# Patient Record
Sex: Female | Born: 1995 | Race: White | Hispanic: No | Marital: Single | Smoking: Never smoker
Health system: Southern US, Community
[De-identification: ages and names within clinical notes are randomized; demographics above are authoritative.]

## PROBLEM LIST (undated history)

## (undated) DIAGNOSIS — G43909 Migraine, unspecified, not intractable, without status migrainosus: Secondary | ICD-10-CM

## (undated) HISTORY — PX: WISDOM TOOTH EXTRACTION: SHX21

## (undated) HISTORY — PX: TYMPANOSTOMY TUBE PLACEMENT: SHX32

---
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
## 2017-01-15 ENCOUNTER — Telehealth: Payer: Self-pay | Admitting: Neurology

## 2017-01-15 NOTE — Telephone Encounter (Signed)
While working on insurance Botox pre-authorization  I noticMidwifeed she is not on the next Botox schedule so I called to see if she is going to proceed with Botox. [place] not [place] able to have Botox in May (our next Botox date). She said she will try to do it in August. She said she will see Dr. Everlena CooperJaffe before August for a follow up appointment. I told her we will work on her insurance pre-approval for Botox after her follow up appointment with Dr. Everlena CooperJaffe.

## 2017-01-24 ENCOUNTER — Ambulatory Visit (INDEPENDENT_AMBULATORY_CARE_PROVIDER_SITE_OTHER): Payer: Managed Care, Other (non HMO) | Admitting: Neurology

## 2017-01-24 ENCOUNTER — Encounter: Payer: Self-pay | Admitting: Neurology

## 2017-01-24 VITALS — BP 106/70 | HR 73 | Ht 64.0 in | Wt 150.2 lb

## 2017-01-24 DIAGNOSIS — R519 Headache, unspecified: Secondary | ICD-10-CM

## 2017-01-24 DIAGNOSIS — G43709 Chronic migraine without aura, not intractable, without status migrainosus: Secondary | ICD-10-CM

## 2017-01-24 DIAGNOSIS — R51 Headache: Secondary | ICD-10-CM

## 2017-01-24 MED ORDER — VENLAFAXINE HCL ER 75 MG PO CP24: 225.0000 mg | ORAL_CAPSULE | Freq: Every day | ORAL | 3 refills | Status: DC

## 2017-01-24 NOTE — Patient Instructions (Signed)
1.  We will increase venlafaxine XR to 225mg  daily.  I will prescribe you 75mg  pills, so take 3 pills daily at once 2.  Limit use of Aleve to 2 days out of the week. 3.  Continue Suzan Nailernzetra Xsail for severe migraine.  Don't use Maxalt anymore. 4.  Will get MRI of brain without contrast 5.  Will get you set up for Botox ASAP

## 2017-01-24 NOTE — Progress Notes (Signed)
NEUROLOGY FOLLOW UP OFFICE NOTE  Leslie Hahn [id number]  HISTORY OF PRESENT ILLNESS: Leslie Hahn is an 21 year old right-handed female with migraines who follows up for migraine.     UPDATE: She reports increased headaches over the past 2 months.  She reports no specific new triggers but may be change in weather.  Still waiting on pre-authorization for Botox. Intensity:  8/10 severe, 4/10 moderate Duration:  Within 2 hours with Leslie Hahn Frequency:  Severe headaches are now 2 days a week (from once a month).  Still with daily moderate headache. Frequency of abortive medication: 2 days a week. Current NSAIDS:  Aleve 660mg  Current analgesics:  no  Current triptans:  Leslie Hahn Current anti-emetic:  no Current muscle relaxants:  no Current anti-anxiolytic:  no Current sleep aide:  no Current Antihypertensive medications:  no Current Antidepressant medications:  venlafaxine XR 150mg  Current Anticonvulsant medications:  no Current Vitamins/Herbal/Supplements:  no Current Antihistamines/Decongestants:  no Other therapy:  no   Caffeine:  no Alcohol:  no Smoker:  no Diet:  Decreased hydration Depression/stress/anxiety:  Her boyfriend says she has increased anxiety.  Increased due to relationship problems with her boyfriend. Sleep hygiene:  good   HISTORY:  Onset:  She sustained a concussion on 10/10/13 while playing soccer. She did not lose consciousness. Migraines started soon afterwards.. Location:  Holocephalic but often right sided Quality:  Sharp/throbbing Initial Intensity:  8/10 severe, 4/10 daily Aura:  no Prodrome:  Hunger, thirsty Associated symptoms:  Nausea, photophobia, phonophobia, osmophobia, sometimes blurred vision or vomiting Initial Duration:  Severe migraines from 3 days up to 2 weeks. Initial Frequency:  Severe migraines once a month (from 3 days to 2 weeks), otherwise daily headache Triggers/exacerbating factors:  no Relieving factors:   no Activity:  Cannot function when severe   Past NSAIDS:  no Past analgesics: Excedrin Past abortive triptans:  sumatriptan tablet, sumatriptan Leslie Hahn, Leslie Hahn Past antihypertensive medications:  no Past antidepressant medications:  amitriptyline (fair improvement) Past anticonvulsant medications:  topiramate 200mg  (only mild improvement and caused irritability)   Family history of headache:  Father has migraines, paternal grandmother had headaches   As per note from social worker, she had an MRI of the brain on 11/30/14, which was normal  PAST MEDICAL HISTORY: Past Medical History:  Diagnosis Date  . Migraine     MEDICATIONS: Current Outpatient Prescriptions on File Prior to Visit  Medication Sig Dispense Refill  . albuterol (PROVENTIL HFA;VENTOLIN HFA) 108 (90 Base) MCG/ACT inhaler Inhale 1-2 puffs into the lungs every 6 (six) hours as needed for wheezing or shortness of breath.    . AMBULATORY NON FORMULARY MEDICATION 1 each by Other route as directed. Medication Name: cefaly band 1 each 0  . cephALEXin (KEFLEX) 500 MG capsule Take 1 capsule (500 mg total) by mouth 2 (two) times daily. 14 capsule 0  . divalproex (DEPAKOTE ER) 500 MG 24 hr tablet     . drospirenone-ethinyl estradiol (NIKKI) 3-0.02 MG tablet Take 1 tablet by mouth daily.    Marland Kitchen loratadine (CLARITIN) 10 MG tablet Take 10 mg by mouth daily.    . naproxen sodium (ANAPROX) 220 MG tablet Take 220 mg by mouth 2 (two) times daily with a meal.    . penicillin v potassium (VEETID) 500 MG tablet     . rizatriptan (Leslie Hahn) 10 MG tablet Take 1 tablet (10 mg total) by mouth as needed for migraine. May repeat in 2 hours if needed 10 tablet 0  . SUMAtriptan  Succinate (ONZETRA XSAIL) 11 MG/NOSEPC EXHP Place 11 mg into the nose as directed. At earliest onset of migraine. May repeat in 2 hours if needed 8 each 3   No current facility-administered medications on file prior to visit.     ALLERGIES: Allergies  Allergen Reactions  .  Compazine [Prochlorperazine Edisylate] Anxiety  . Reglan [Metoclopramide] Anxiety    FAMILY HISTORY: No family history on file.  SOCIAL HISTORY: Social History   Social History  . Marital status: Single    Spouse name: N/A  . Number of children: N/A  . Years of education: N/A   Occupational History  . Not on file.   Social History Main Topics  . Smoking status: Never Smoker  . Smokeless tobacco: Never Used  . Alcohol use No  . Drug use: No  . Sexual activity: Yes    Birth control/ protection: Pill   Other Topics Concern  . Not on file   Social History Narrative  . No narrative on file    REVIEW OF SYSTEMS: Constitutional: No fevers, chills, or sweats, no generalized fatigue, change in appetite Eyes: No visual changes, double vision, eye pain Ear, nose and throat: No hearing loss, ear pain, nasal congestion, sore throat Cardiovascular: No chest pain, palpitations Respiratory:  No shortness of breath at rest or with exertion, wheezes GastrointestinaI: No nausea, vomiting, diarrhea, abdominal pain, fecal incontinence Genitourinary:  No dysuria, urinary retention or frequency Musculoskeletal:  No neck pain, back pain Integumentary: No rash, pruritus, skin lesions Neurological: as above Psychiatric: No depression, insomnia, anxiety Endocrine: No palpitations, fatigue, diaphoresis, mood swings, change in appetite, change in weight, increased thirst Hematologic/Lymphatic:  No purpura, petechiae. Allergic/Immunologic: no itchy/runny eyes, nasal congestion, recent allergic reactions, rashes  PHYSICAL EXAM: Vitals:   01/24/17 0736  BP: 106/70  Pulse: 73   General: No acute distress.  Patient appears well-groomed.  normal body habitus. Head:  Normocephalic/atraumatic Eyes:  Fundi examined but not visualized Neck: supple, no paraspinal tenderness, full range of motion Heart:  Regular rate and rhythm Lungs:  Clear to auscultation bilaterally Back: No paraspinal  tenderness Neurological Exam: alert and oriented to person, place, and time. Attention span and concentration intact, recent and remote memory intact, fund of knowledge intact.  Speech fluent and not dysarthric, language intact.  CN II-XII intact. Bulk and tone normal, muscle strength 5/5 throughout.  Sensation to light touch  intact.  Deep tendon reflexes 2+ throughout.  Finger to nose testing intact.  Gait normal  IMPRESSION: Chronic migraine, worsening  PLAN: 1.  Since she is having increase in severe migraines, I would like to check an MRI of the brain.  She reportedly had an MRI several years ago, but I do not have those images or report and this is new since then. 2.  She had a positive response to venlafaxine, so we will increase dose to 225mg  daily. 3.  Will get Botox set up ASAP. 4.  Aleve or Leslie Nailernzetra Xsail (limited to no more than 2 days out of the week).  Shon MilletAdam Damyia Strider, DO

## 2017-01-29 ENCOUNTER — Ambulatory Visit
Admission: RE | Admit: 2017-01-29 | Discharge: 2017-01-29 | Disposition: A | Discharge status: Home / Self Care | Payer: Managed Care, Other (non HMO) | Source: Ambulatory Visit | Attending: Neurology | Admitting: Neurology

## 2017-01-29 DIAGNOSIS — R519 Headache, unspecified: Secondary | ICD-10-CM

## 2017-01-29 DIAGNOSIS — R51 Headache: Principal | ICD-10-CM

## 2017-01-30 ENCOUNTER — Telehealth: Payer: Self-pay | Admitting: *Deleted

## 2017-01-30 ENCOUNTER — Encounter: Payer: Self-pay | Admitting: *Deleted

## 2017-01-30 NOTE — Telephone Encounter (Signed)
-----   Message from Drema DallasAdam R Jaffe, DO sent at 01/30/2017  6:35 AM EST ----- MRI of brain is normal

## 2017-01-30 NOTE — Telephone Encounter (Signed)
Results given to patient via My Chart.

## 2017-02-05 ENCOUNTER — Ambulatory Visit: Admit: 2017-02-05 | Discharge: 2017-02-05 | Payer: PRIVATE HEALTH INSURANCE | Attending: Family

## 2017-02-05 DIAGNOSIS — D229 Melanocytic nevi, unspecified: Secondary | ICD-10-CM

## 2017-02-05 NOTE — Unmapped (Signed)
Spoke with mom Lawson Fiscal) regarding biopsy results.  Benign. No further treatment required.

## 2017-02-05 NOTE — Unmapped (Signed)
Chief Complaint   Patient presents with   ??? Skin Lesion     FSE, pt is concern @ mole on R arm       HPI: Pt is 21 y.o.  white female who is here today for evaluation of the following:    1. Asymptomatic brown and tan lesion scattered on body x years. No known changes. No mod factors. No treatments.  2. Tender brown and pink lesion on right upper arm x 6 months. No known trauma. No mod factors. No treatments.    Review of Systems   Skin as above.  Hematological: Does not bruise/bleed easily.   Allergies reviewed     Derm History: LOV: 02/26/2016--per [facility]  Family history of melanoma- (-)  Personal history of NMSC or MM- (-)  sunburns easily- (+)  uses sunscreen- (+)  history of tanning bed use- (-)    Physical Examination: FSE    Examination was performed of the following: scalp/hair, head/face, conjunctivae/eyelids, gums/teeth/lips, neck, breast/axilla/chest, abdomen, back, RUE, LUE, RLE, LLE, nails/digits and buttocks    Abnormalities noted include: RUE    1. Body with scattered round well defined uniformly colored brown and pink smooth macules and papules  2. Right upper arm with 5 mm brown to pink well define round smooth papule.    A & P:    1. Multiple nevi - benign appearing  - diagnosis and etiology reviewed  - reassured patient regarding benign nature of lesions  - no treatment medically necessary  -educated on ABCDE of changing lesions- RTC for any changes  -annual FSE- perform monthly home self exams- may take pictures to compare    2. IDN - right upper arm shave biopsy  -education  Time out performed.  1% lidocaine with epinephrine injected.  Biopsy performed to the dermal layer of skin and sent to pathology.  Patient educated about scar, s/s of infection, dyspigmentation- written instructions provided to patient in a packet.    Post surgical area may appear red, this is normal. Report any abnormal drainage or odor from site.

## 2017-02-21 ENCOUNTER — Encounter (HOSPITAL_COMMUNITY): Payer: Self-pay | Admitting: *Deleted

## 2017-02-21 ENCOUNTER — Emergency Department (HOSPITAL_COMMUNITY)
Admission: EM | Admit: 2017-02-21 | Discharge: 2017-02-21 | Disposition: A | Discharge status: Home / Self Care | Payer: Managed Care, Other (non HMO) | Attending: Emergency Medicine | Admitting: Emergency Medicine

## 2017-02-21 DIAGNOSIS — Z79899 Other long term (current) drug therapy: Secondary | ICD-10-CM | POA: Diagnosis not present

## 2017-02-21 DIAGNOSIS — R51 Headache: Secondary | ICD-10-CM | POA: Insufficient documentation

## 2017-02-21 DIAGNOSIS — R519 Headache, unspecified: Secondary | ICD-10-CM

## 2017-02-21 MED ORDER — SODIUM CHLORIDE 0.9 % IV BOLUS (SEPSIS)
500.0000 mL | Freq: Once | INTRAVENOUS | Status: AC
  Administered 2017-02-21: 500 mL via INTRAVENOUS

## 2017-02-21 MED ORDER — DIPHENHYDRAMINE HCL 50 MG/ML IJ SOLN
25.0000 mg | Freq: Once | INTRAMUSCULAR | Status: AC
  Administered 2017-02-21: 25 mg via INTRAVENOUS
  Filled 2017-02-21: qty 1

## 2017-02-21 MED ORDER — KETOROLAC TROMETHAMINE 30 MG/ML IJ SOLN
30.0000 mg | Freq: Once | INTRAMUSCULAR | Status: AC
  Administered 2017-02-21: 30 mg via INTRAVENOUS
  Filled 2017-02-21: qty 1

## 2017-02-21 MED ORDER — ACETAMINOPHEN 325 MG PO TABS
650.0000 mg | ORAL_TABLET | Freq: Once | ORAL | Status: AC
  Administered 2017-02-21: 650 mg via ORAL
  Filled 2017-02-21: qty 2

## 2017-02-21 MED ORDER — ONDANSETRON HCL 4 MG/2ML IJ SOLN
4.0000 mg | Freq: Once | INTRAMUSCULAR | Status: AC
  Administered 2017-02-21: 4 mg via INTRAVENOUS
  Filled 2017-02-21: qty 2

## 2017-02-21 NOTE — ED Provider Notes (Signed)
[facility]-EMERGENCY DEPT Provider Note   CSN: [id number] Arrival date & time: 02/21/17  1259     History   Chief Complaint Chief Complaint  Patient presents with  . Migraine    HPI Leslie Hahn is a 21 y.o. female.  Patient with history of migraine headaches presents with complaint of two-day history of occipital headache consistent with previous. Patient has had associated dizziness. No aura. She has associated phonophobia and photophobia. No improvement with home medications. No weakness in arms or legs. No facial droop or difficulty speaking. She feels lightheaded with walking but has not fallen. She had subjective fever this morning but has no pain in her neck. She has no history of head trauma. No dental pain or sinus infection symptoms reported. The onset of this condition was acute. The course is constant.       Past Medical History:  Diagnosis Date  . Migraine     There are no active problems to display for this patient.   Past Surgical History:  Procedure Laterality Date  . TYMPANOSTOMY TUBE PLACEMENT    . WISDOM TOOTH EXTRACTION      OB History    No data available       Home Medications    Prior to Admission medications   Medication Sig Start Date End Date Taking? Authorizing Provider  albuterol (PROVENTIL HFA;VENTOLIN HFA) 108 (90 Base) MCG/ACT inhaler Inhale 1-2 puffs into the lungs every 6 (six) hours as needed for wheezing or shortness of breath.   Yes Historical Provider, MD  drospirenone-ethinyl estradiol (NIKKI) 3-0.02 MG tablet Take 1 tablet by mouth daily.   Yes Historical Provider, MD  loratadine (CLARITIN) 10 MG tablet Take 10 mg by mouth daily as needed for allergies.    Yes Historical Provider, MD  naproxen sodium (ANAPROX) 220 MG tablet Take 660 mg by mouth 2 (two) times daily as needed (pain).    Yes Historical Provider, MD  rizatriptan (MAXALT) 10 MG tablet Take 1 tablet (10 mg total) by mouth as needed for migraine. May repeat in 2 hours if  needed 12/16/16  Yes Adam Mliss Fritz Jaffe, DO  SUMAtriptan Succinate (ONZETRA XSAIL) 11 MG/NOSEPC EXHP Place 11 mg into the nose as directed. At earliest onset of migraine. May repeat in 2 hours if needed 09/17/16  Yes Drema DallasAdam R Jaffe, DO  venlafaxine XR (EFFEXOR-XR) 75 MG 24 hr capsule Take 3 capsules (225 mg total) by mouth daily with breakfast. 01/24/17  Yes Drema DallasAdam R Jaffe, DO    Family History No family history on file.  Social History Social History  Substance Use Topics  . Smoking status: Never Smoker  . Smokeless tobacco: Never Used  . Alcohol use No     Allergies   Compazine [prochlorperazine edisylate] and Reglan [metoclopramide]   Review of Systems Review of Systems  Constitutional: Negative for fever.  HENT: Negative for congestion, dental problem, rhinorrhea and sinus pressure.   Eyes: Positive for photophobia. Negative for discharge, redness and visual disturbance.  Respiratory: Negative for shortness of breath.   Cardiovascular: Negative for chest pain.  Gastrointestinal: Negative for nausea and vomiting.  Musculoskeletal: Negative for gait problem, neck pain and neck stiffness.  Skin: Negative for rash.  Neurological: Positive for light-headedness and headaches. Negative for syncope, speech difficulty, weakness and numbness.  Psychiatric/Behavioral: Negative for confusion.     Physical Exam Updated Vital Signs BP 119/72 (BP Location: Right Arm)   Pulse (!) 116   Temp 99 F (37.2 C) (Oral)  Resp 14   Ht 5\' 4"  (1.626 m)   Wt 63.5 kg   LMP 01/25/2017   SpO2 100%   BMI 24.03 kg/m   Physical Exam  Constitutional: She is oriented to person, place, and time. She appears well-developed and well-nourished.  Pt lying in a dark room.   HENT:  Head: Normocephalic and atraumatic.  Right Ear: Tympanic membrane, external ear and ear canal normal.  Left Ear: Tympanic membrane, external ear and ear canal normal.  Nose: Nose normal.  Mouth/Throat: Uvula is midline, oropharynx  is clear and moist and mucous membranes are normal.  Eyes: Conjunctivae, EOM and lids are normal. Pupils are equal, round, and reactive to light. Right eye exhibits no nystagmus. Left eye exhibits no nystagmus.  Neck: Normal range of motion. Neck supple.  Cardiovascular: Normal rate and regular rhythm.   No murmur heard. Pulmonary/Chest: Effort normal and breath sounds normal. No respiratory distress. She has no wheezes. She has no rales.  Abdominal: Soft. There is no tenderness.  Musculoskeletal:       Cervical back: She exhibits normal range of motion, no tenderness and no bony tenderness.  Neurological: She is alert and oriented to person, place, and time. She has normal strength and normal reflexes. No cranial nerve deficit or sensory deficit. She displays a negative Romberg sign. Coordination and gait normal. GCS eye subscore is 4. GCS verbal subscore is 5. GCS motor subscore is 6.  Skin: Skin is warm and dry.  Psychiatric: She has a normal mood and affect.  Nursing note and vitals reviewed.    ED Treatments / Results   Procedures Procedures (including critical care time)  Medications Ordered in ED Medications - No data to display   Initial Impression / Assessment and Plan / ED Course  I have reviewed the triage vital signs and the nursing notes.  Pertinent labs & imaging results that were available during my care of the patient were reviewed by me and considered in my medical decision making (see chart for details).     Patient seen and examined. Work-up initiated. Medications ordered.   Vital signs reviewed and are as follows: BP 119/72 (BP Location: Right Arm)   Pulse (!) 116   Temp 99 F (37.2 C) (Oral)   Resp 14   Ht 5\' 4"  (1.626 m)   Wt 63.5 kg   LMP 01/25/2017   SpO2 100%   BMI 24.03 kg/m   3:26 PM patient feels much better and is ready for discharge to home. Of note, temperature is 99.16F. On reexam, she continues to have no signs of meningismus. Tylenol  ordered.  Patient encouraged to return to emergency department if she develops worsening headache, persistent vomiting, pain with moving her neck, confusion.   Patient counseled to return if they have weakness in their arms or legs, slurred speech, trouble walking or talking, confusion, trouble with their balance, or if they have any other concerns. Patient verbalizes understanding and agrees with plan.    Final Clinical Impressions(s) / ED Diagnoses   Final diagnoses:  Bad headache   Patient with Occipital headache consistent with previous headaches. Patient without high-risk features of headache including: sudden onset/thunderclap HA, no similar headache in past, altered mental status, accompanying seizure, headache with exertion, age > 6, history of immunocompromise, neck or shoulder pain, fever, use of anticoagulation, family history of spontaneous SAH, concomitant drug use, toxic exposure.   Patient has a normal complete neurological exam, normal vital signs, normal level of consciousness,  no signs of meningismus, is well-appearing/non-toxic appearing, no signs of trauma.  Imaging with CT/MRI not indicated given history and physical exam findings.   No dangerous or life-threatening conditions suspected or identified by history, physical exam, and by work-up. No indications for hospitalization identified.    New Prescriptions Discharge Medication List as of 02/21/2017  3:10 PM       Renne Crigler, PA-C 02/21/17 1527    Canary Brim Tegeler, MD 02/21/17 2009

## 2017-02-21 NOTE — Discharge Instructions (Signed)
Please read and follow all provided instructions.  Your diagnoses today include:  1. Bad headache     Tests performed today include:  Vital signs. See below for your results today.   Medications:  In the Emergency Department you received:  Zofran - antinausea/headache medication  Benadryl - antihistamine to counteract potential side effects of reglan  Toradol - NSAID medication similar to ibuprofen  Take any prescribed medications only as directed.  Additional information:  Follow any educational materials contained in this packet.  You are having a headache. No specific cause was found today for your headache. It may have been a migraine or other cause of headache. Stress, anxiety, fatigue, and depression are common triggers for headaches.   Your headache today does not appear to be life-threatening or require hospitalization, but often the exact cause of headaches is not determined in the emergency department. Therefore, follow-up with your doctor is very important to find out what may have caused your headache and whether or not you need any further diagnostic testing or treatment.   Sometimes headaches can appear benign (not harmful), but then more serious symptoms can develop which should prompt an immediate re-evaluation by your doctor or the emergency department.  BE VERY CAREFUL not to take multiple medicines containing Tylenol (also called acetaminophen). Doing so can lead to an overdose which can damage your liver and cause liver failure and possibly death.   Follow-up instructions: Please follow-up with your primary care provider in the next 3 days for further evaluation of your symptoms.   Return instructions:   Please return to the Emergency Department if you experience worsening symptoms.  Return if the medications do not resolve your headache, if it recurs, or if you have multiple episodes of vomiting or cannot keep down fluids.  Return if you have a change  from the usual headache.  RETURN IMMEDIATELY IF you:  Develop a sudden, severe headache  Develop confusion or become poorly responsive or faint  Develop a fever above 100.31F or problem breathing  Have a change in speech, vision, swallowing, or understanding  Develop new weakness, numbness, tingling, incoordination in your arms or legs  Have a seizure  Please return if you have any other emergent concerns.  Additional Information:  Your vital signs today were: BP 102/60 (BP Location: Left Arm)    Pulse (!) 103    Temp 99.9 F (37.7 C) (Oral)    Resp 12    Ht 5\' 4"  (1.626 m)    Wt 63.5 kg    LMP 01/25/2017    SpO2 97%    BMI 24.03 kg/m  If your blood pressure (BP) was elevated above 135/85 this visit, please have this repeated by your doctor within one month. --------------

## 2017-02-21 NOTE — ED Triage Notes (Signed)
Pt complains of 9/10 migraine since last night. Pt has not taken medication for migraine. Pt states she is sensitive to light and sound.

## 2017-02-23 ENCOUNTER — Emergency Department (HOSPITAL_COMMUNITY)
Admission: EM | Admit: 2017-02-23 | Discharge: 2017-02-24 | Disposition: A | Discharge status: Home / Self Care | Payer: Managed Care, Other (non HMO) | Attending: Emergency Medicine | Admitting: Emergency Medicine

## 2017-02-23 ENCOUNTER — Emergency Department (HOSPITAL_COMMUNITY): Payer: Managed Care, Other (non HMO)

## 2017-02-23 ENCOUNTER — Encounter (HOSPITAL_COMMUNITY): Payer: Self-pay | Admitting: Oncology

## 2017-02-23 DIAGNOSIS — G43009 Migraine without aura, not intractable, without status migrainosus: Secondary | ICD-10-CM | POA: Insufficient documentation

## 2017-02-23 DIAGNOSIS — Z79899 Other long term (current) drug therapy: Secondary | ICD-10-CM | POA: Diagnosis not present

## 2017-02-23 DIAGNOSIS — G43909 Migraine, unspecified, not intractable, without status migrainosus: Secondary | ICD-10-CM | POA: Diagnosis present

## 2017-02-23 LAB — BASIC METABOLIC PANEL
Anion gap: 7 (ref 5–15)
BUN: 9 mg/dL (ref 6–20)
CO2: 26 mmol/L (ref 22–32)
Calcium: 9.4 mg/dL (ref 8.9–10.3)
Chloride: 105 mmol/L (ref 101–111)
Creatinine, Ser: 0.77 mg/dL (ref 0.44–1.00)
GFR calc non Af Amer: >60 mL/min (ref >60)
Glucose, Bld: 89 mg/dL (ref 65–99)
POTASSIUM: 3.8 mmol/L (ref 3.5–5.1)
Sodium: 138 mmol/L (ref 135–145)

## 2017-02-23 LAB — CBC WITH DIFFERENTIAL/PLATELET
BASOS ABS: 0 10*3/uL (ref 0.0–0.1)
BASOS PCT: 0 %
Eosinophils Absolute: 0.4 10*3/uL (ref 0.0–0.7)
Eosinophils Relative: 4 %
HEMATOCRIT: 36.1 % (ref 36.0–46.0)
HEMOGLOBIN: 12 g/dL (ref 12.0–15.0)
LYMPHS PCT: 33 %
Lymphs Abs: 3.4 10*3/uL (ref 0.7–4.0)
MCH: 27.6 pg (ref 26.0–34.0)
MCHC: 33.2 g/dL (ref 30.0–36.0)
MCV: 83 fL (ref 78.0–100.0)
MONO ABS: 1 10*3/uL (ref 0.1–1.0)
MONOS PCT: 10 %
NEUTROS PCT: 53 %
Neutro Abs: 5.5 10*3/uL (ref 1.7–7.7)
Platelets: 324 10*3/uL (ref 150–400)
RBC: 4.35 MIL/uL (ref 3.87–5.11)
RDW: 13.2 % (ref 11.5–15.5)
WBC: 10.4 10*3/uL (ref 4.0–10.5)

## 2017-02-23 LAB — URINALYSIS, ROUTINE W REFLEX MICROSCOPIC
BILIRUBIN URINE: NEGATIVE
Glucose, UA: NEGATIVE mg/dL
KETONES UR: NEGATIVE mg/dL
LEUKOCYTES UA: NEGATIVE
NITRITE: NEGATIVE
PROTEIN: NEGATIVE mg/dL
SPECIFIC GRAVITY, URINE: 1.02 (ref 1.005–1.030)
pH: 5 (ref 5.0–8.0)

## 2017-02-23 LAB — I-STAT BETA HCG BLOOD, ED (MC, WL, AP ONLY): I-stat hCG, quantitative: <5 m[IU]/mL (ref <5)

## 2017-02-23 LAB — RAPID URINE DRUG SCREEN, HOSP PERFORMED
Amphetamines: NOT DETECTED
Barbiturates: NOT DETECTED
Benzodiazepines: NOT DETECTED
Cocaine: NOT DETECTED
OPIATES: NOT DETECTED
Tetrahydrocannabinol: NOT DETECTED

## 2017-02-23 MED ORDER — KETOROLAC TROMETHAMINE 30 MG/ML IJ SOLN
30.0000 mg | Freq: Once | INTRAMUSCULAR | Status: AC
  Administered 2017-02-23: 30 mg via INTRAVENOUS
  Filled 2017-02-23: qty 1

## 2017-02-23 MED ORDER — SODIUM CHLORIDE 0.9 % IV BOLUS (SEPSIS)
1000.0000 mL | Freq: Once | INTRAVENOUS | Status: AC
  Administered 2017-02-23: 1000 mL via INTRAVENOUS

## 2017-02-23 MED ORDER — ONDANSETRON HCL 4 MG/2ML IJ SOLN
4.0000 mg | Freq: Once | INTRAMUSCULAR | Status: AC
  Administered 2017-02-23: 4 mg via INTRAVENOUS
  Filled 2017-02-23: qty 2

## 2017-02-23 NOTE — ED Provider Notes (Signed)
[facility]-EMERGENCY DEPT Provider Note   CSN: [id number] Arrival date & time: 02/23/17  2110     History   Chief Complaint Chief Complaint  Patient presents with  . Migraine    HPI Leslie Hahn is a 21 y.o. female.  HPI   Leslie Hahn is a 21 y.o. female, with a history of migraine, presenting to the ED with a headache recurrent since 3/23. Her current headache is left sided occipital and forward to behind her left eye. Pain is throbbing/shooting and rated 8/10. This pain pattern is similar to previous headaches. Endorses photophobia and nausea.   Patient states she was seen for a migraine headache on 3/23. The treatments performed in the ED relieved her pain completely. The next day, her headache came back and she began to have increased pain when she turned her head. She initially stated that she returned today because of "neck stiffness," but then clarified that she didn't feel like she has difficulty turning her head or putting chin to chest, but rather she didn't want to move her head as much because doing so increased the pain. She states that this sensation has since reduced significantly.   Patient is prescribed effexor for migraines and is compliant. She has not been taking any medications once this headache began, so last aleve was 3/22. She states she has been drinking at least 75oz of water daily.   She sees Dr. Everlena Cooper here while she is in school at [employer] [facility] [employer]. She is from [place] and sees a neurologist there as well. She states she is supposed to start Botox soon for treatment of her migraines as she is having them on a weekly basis.   She denies vomiting, fever, visual disturbances, LOC, neuro deficits, or any other complaints. LMP around February 24.  Past Medical History:  Diagnosis Date  . Migraine     There are no active problems to display for this patient.   Past Surgical History:  Procedure Laterality Date  . TYMPANOSTOMY TUBE PLACEMENT    . WISDOM TOOTH EXTRACTION       OB History    No data available       Home Medications    Prior to Admission medications   Medication Sig Start Date End Date Taking? Authorizing Provider  albuterol (PROVENTIL HFA;VENTOLIN HFA) 108 (90 Base) MCG/ACT inhaler Inhale 1-2 puffs into the lungs every 6 (six) hours as needed for wheezing or shortness of breath.   Yes Historical Provider, MD  drospirenone-ethinyl estradiol (NIKKI) 3-0.02 MG tablet Take 1 tablet by mouth daily.   Yes Historical Provider, MD  loratadine (CLARITIN) 10 MG tablet Take 10 mg by mouth daily as needed for allergies.    Yes Historical Provider, MD  rizatriptan (MAXALT) 10 MG tablet Take 1 tablet (10 mg total) by mouth as needed for migraine. May repeat in 2 hours if needed 12/16/16  Yes Drema Dallas, DO  venlafaxine XR (EFFEXOR-XR) 75 MG 24 hr capsule Take 3 capsules (225 mg total) by mouth daily with breakfast. 01/24/17  Yes Drema Dallas, DO  butalbital-acetaminophen-caffeine (FIORICET, ESGIC) [phone] MG tablet Take 1-2 tablets by mouth every 6 (six) hours as needed for headache. 02/24/17 02/24/18  Shawn C Joy, PA-C  SUMAtriptan Succinate (ONZETRA XSAIL) 11 MG/NOSEPC EXHP Place 11 mg into the nose as directed. At earliest onset of migraine. May repeat in 2 hours if needed Patient not taking: Reported on 02/23/2017 09/17/16   Drema Dallas, DO    Family History No  family history on file.  Social History Social History  Substance Use Topics  . Smoking status: Never Smoker  . Smokeless tobacco: Never Used  . Alcohol use No     Allergies   Compazine [prochlorperazine edisylate] and Reglan [metoclopramide]   Review of Systems Review of Systems  Constitutional: Negative for chills, diaphoresis and fever.  HENT: Negative for sore throat.   Eyes: Negative for visual disturbance.  Respiratory: Negative for shortness of breath.   Cardiovascular: Negative for chest pain.  Gastrointestinal: Positive for nausea. Negative for abdominal pain and  vomiting.  Musculoskeletal: Positive for neck pain. Negative for back pain.  Skin: Negative for rash.  Neurological: Positive for headaches. Negative for dizziness, syncope, weakness and numbness.  All other systems reviewed and are negative.    Physical Exam Updated Vital Signs BP 125/85 (BP Location: Right Arm)   Pulse 96   Temp 98.9 F (37.2 C) (Oral)   Resp 18   Ht 5\' 4"  (1.626 m)   Wt 63.5 kg   LMP 01/25/2017   SpO2 100%   BMI 24.03 kg/m   Physical Exam  Constitutional: She appears well-developed and well-nourished. No distress.  HENT:  Head: Normocephalic and atraumatic.  Mouth/Throat: Oropharynx is clear and moist.  Eyes: Conjunctivae and EOM are normal. Pupils are equal, round, and reactive to light.  Pupils about 6mm, equal bilaterally.  Neck: Normal range of motion. Neck supple. No Brudzinski's sign and no Kernig's sign noted.  Patient moves her neck without hesitation.   Cardiovascular: Normal rate, regular rhythm, normal heart sounds and intact distal pulses.   Pulmonary/Chest: Effort normal and breath sounds normal. No respiratory distress.  Abdominal: Soft. There is no tenderness. There is no guarding.  Musculoskeletal: She exhibits no edema.  Tenderness to the left cervical musculature into the left occipital region.  Normal motor function intact in all extremities and spine. No midline spinal tenderness.   Lymphadenopathy:    She has no cervical adenopathy.  Neurological: She is alert.  No sensory deficits. Strength 5/5 in all extremities. No gait disturbance. Coordination intact including heel to shin and finger to nose. Cranial nerves III-XII grossly intact. No facial droop.   Skin: Skin is warm and dry. She is not diaphoretic.  Psychiatric: She has a normal mood and affect. Her behavior is normal.  Nursing note and vitals reviewed.    ED Treatments / Results  Labs (all labs ordered are listed, but only abnormal results are displayed) Labs Reviewed    URINALYSIS, ROUTINE W REFLEX MICROSCOPIC - Abnormal; Notable for the following:       Result Value   APPearance HAZY (*)    Hgb urine dipstick MODERATE (*)    Bacteria, UA RARE (*)    Squamous Epithelial / LPF 0-5 (*)    All other components within normal limits  BASIC METABOLIC PANEL  CBC WITH DIFFERENTIAL/PLATELET  RAPID URINE DRUG SCREEN, HOSP PERFORMED  I-STAT BETA HCG BLOOD, ED ([facility], [facility], [facility] ONLY)    EKG  EKG Interpretation None       Radiology Ct Head Wo Contrast  Result Date: 02/23/2017 CLINICAL DATA:  Migraine headache. EXAM: CT HEAD WITHOUT CONTRAST TECHNIQUE: Contiguous axial images were obtained from the base of the skull through the vertex without intravenous contrast. COMPARISON:  MRI of January 29, 2017. FINDINGS: Brain: No evidence of acute infarction, hemorrhage, hydrocephalus, extra-axial collection or mass lesion/mass effect. Vascular: No hyperdense vessel or unexpected calcification. Skull: Normal. Negative for fracture or focal lesion.  Sinuses/Orbits: No acute finding. Other: None. IMPRESSION: Normal head CT. Electronically Signed   By: Lupita Raider, M.D.   On: 02/23/2017 23:49    Procedures Procedures (including critical care time)  Medications Ordered in ED Medications  sodium chloride 0.9 % bolus 1,000 mL (0 mLs Intravenous Stopped 02/24/17 0017)  ketorolac (TORADOL) 30 MG/ML injection 30 mg (30 mg Intravenous Given 02/23/17 2309)  ondansetron (ZOFRAN) injection 4 mg (4 mg Intravenous Given 02/23/17 2309)  dexamethasone (DECADRON) injection 10 mg (10 mg Intravenous Given 02/24/17 0014)     Initial Impression / Assessment and Plan / ED Course  I have reviewed the triage vital signs and the nursing notes.  Pertinent labs & imaging results that were available during my care of the patient were reviewed by me and considered in my medical decision making (see chart for details).     Patient presents with a recurrence of her headache. She was initially  triaged with a complaint of "neck stiffness," however, upon obtaining further details, patient has some increase in her headache with movement of her neck, but no stiffness. My suspicion for meningitis in this patient is very low. She has no neuro or functional deficits.  Patient is nontoxic appearing, afebrile, not tachycardic, not tachypneic, not hypotensive, maintains SPO2 of 98-100% on room air, and is in no apparent distress. Patient has no signs of sepsis or other serious or life-threatening condition.  Patient had complete resolution in her headache with conservative measures here in the ED. Patient encouraged to follow up with her neurologist as soon as possible. The patient was given instructions for home care as well as return precautions. Patient voices understanding of these instructions, accepts the plan, and is comfortable with discharge.   Vitals:   02/23/17 2230 02/23/17 2305 02/23/17 2309 02/24/17 0013  BP: 120/72 118/77 118/77 112/72  Pulse: 81 82 87 79  Resp: 16  18 16   Temp:      TempSrc:      SpO2: 99% 98% 100% 100%  Weight:      Height:         Final Clinical Impressions(s) / ED Diagnoses   Final diagnoses:  Migraine without aura and without status migrainosus, not intractable    New Prescriptions Discharge Medication List as of 02/24/2017  1:18 AM    START taking these medications   Details  butalbital-acetaminophen-caffeine (FIORICET, ESGIC) 50-325-40 MG tablet Take 1-2 tablets by mouth every 6 (six) hours as needed for headache., Starting Mon 02/24/2017, Until Tue 02/24/2018, Print         Anselm Pancoast, PA-C 02/24/17 0236    Dione Booze, MD 03/19/17 1214

## 2017-02-23 NOTE — ED Notes (Signed)
Patient transported to CT 

## 2017-02-23 NOTE — ED Triage Notes (Signed)
Pt c/o HA, stiffness in her neck, lethargy, sensitivity to light/sound and , "Feeling out of it."  Sx present since Friday.  Pt states she was seen Friday for the same and instructed to return should the HA get worse to she were to develop neck stiffness.  Pt rates pain 8/10.

## 2017-02-24 MED ORDER — DEXAMETHASONE SODIUM PHOSPHATE 10 MG/ML IJ SOLN
10.0000 mg | Freq: Once | INTRAMUSCULAR | Status: AC
  Administered 2017-02-24: 10 mg via INTRAVENOUS
  Filled 2017-02-24: qty 1

## 2017-02-24 MED ORDER — BUTALBITAL-APAP-CAFFEINE 50-325-40 MG PO TABS: 1.0000 | ORAL_TABLET | Freq: Four times a day (QID) | ORAL | 0 refills | Status: AC | PRN

## 2017-02-24 NOTE — Discharge Instructions (Signed)
You have been seen today for headache. Please continue to drink plenty of fluids to stay well hydrated. Reduce stress. Get plenty of sleep each night. For onset of headache, you may try using the Fioricet. Alternatively, you may use any of the medications you have been previously prescribed for treatment of your headaches. We discussed possibly adding sumatriptan to your regimen, however, you have already been prescribed this in the form of the nasal spray. Please follow-up with your primary care provider as well as your neurologist on this matter.

## 2017-02-24 NOTE — ED Notes (Signed)
ED Provider at bedside. 

## 2017-03-04 ENCOUNTER — Ambulatory Visit (INDEPENDENT_AMBULATORY_CARE_PROVIDER_SITE_OTHER): Payer: Managed Care, Other (non HMO) | Admitting: Neurology

## 2017-03-04 DIAGNOSIS — G43709 Chronic migraine without aura, not intractable, without status migrainosus: Secondary | ICD-10-CM

## 2017-03-04 DIAGNOSIS — IMO0002 Reserved for concepts with insufficient information to code with codable children: Secondary | ICD-10-CM

## 2017-03-04 MED ORDER — ONABOTULINUMTOXINA 100 UNITS IJ SOLR
155.0000 [IU] | Freq: Once | INTRAMUSCULAR | Status: AC
  Administered 2017-03-04: 155 [IU] via INTRAMUSCULAR

## 2017-03-04 NOTE — Progress Notes (Signed)
[facility]   Procedure Note Botox  Attending: Dr. Nori Poland  Preoperative Diagnosis(es): Chronic migraine  Consent obtained from: The patient Benefits discussed included, but were not limited to decreased muscle tightness, increased joint range of motion, and decreased pain.  Risk discussed included, but were not limited pain and discomfort, bleeding, bruising, excessive weakness, venous thrombosis, muscle atrophy and dysphagia.  Anticipated outcomes of the procedure as well as he risks and benefits of the alternatives to the procedure, and the roles and tasks of the personnel to be involved, were discussed with the patient, and the patient consents to the procedure and agrees to proceed. A copy of the patient medication guide was given to the patient which explains the blackbox warning.  Patients identity and treatment sites confirmed Yes.  .  Details of Procedure: Skin was cleaned with alcohol. Prior to injection, the needle plunger was aspirated to make sure the needle was not within a blood vessel.  There was no blood retrieved on aspiration.    Following is a summary of the muscles injected  And the amount of Botulinum toxin used:  Dilution 200 units of Botox was reconstituted with 4 ml of preservative free normal saline. Time of reconstitution: At the time of the office visit (<30 minutes prior to injection)   Injections  155 total units of Botox was injected with a 30 gauge needle.  Injection Sites: L occipitalis: 15 units- 3 sites  R occiptalis: 15 units- 3 sites  L upper trapezius: 15 units- 3 sites R upper trapezius: 15 units- 3 sits          L paraspinal: 10 units- 2 sites R paraspinal: 10 units- 2 sites  Face L frontalis(2 injection sites):10 units   R frontalis(2 injection sites):10 units         L corrugator: 5 units   R corrugator: 5 units           Procerus: 5 units   L temporalis: 20 units R temporalis: 20 units   Agent:  200 units of botulinum Type A  (Onobotulinum Toxin type A) was reconstituted with 4 ml of preservative free normal saline.  Time of reconstitution: At the time of the office visit (<30 minutes prior to injection)     Total injected (Units): 155  Total wasted (Units): 45  Patient tolerated procedure well without complications.   Reinjection is anticipated in 3 months.  Tony Friscia R. Morey Andonian, DO   

## 2017-03-04 NOTE — Addendum Note (Signed)
Addended bySilvio Pate on: 03/04/2017 04:17 PM   Modules accepted: Orders

## 2017-05-26 ENCOUNTER — Other Ambulatory Visit: Payer: Self-pay | Admitting: Neurology

## 2017-06-03 ENCOUNTER — Ambulatory Visit: Payer: Managed Care, Other (non HMO) | Admitting: Neurology

## 2017-06-16 ENCOUNTER — Ambulatory Visit: Payer: PRIVATE HEALTH INSURANCE

## 2017-07-04 ENCOUNTER — Ambulatory Visit: Payer: Managed Care, Other (non HMO) | Admitting: Neurology

## 2017-07-17 ENCOUNTER — Ambulatory Visit: Admit: 2017-07-17 | Discharge: 2017-07-17 | Payer: PRIVATE HEALTH INSURANCE | Attending: Family

## 2017-07-17 DIAGNOSIS — D485 Neoplasm of uncertain behavior of skin: Secondary | ICD-10-CM

## 2017-07-17 NOTE — Unmapped (Signed)
Chief Complaint   Patient presents with   ??? Skin Lesion     under arm       HPI: Pt is 21 y.o.  white female who is here today for evaluation of the following:    1. Tender pink lesion enlarging on right axilla x 6 months. No known trauma. No mod factors. No treatments. Pt would like removed.    Review of Systems   Skin as above.  Hematological: Does not bruise/bleed easily.   Allergies reviewed     Derm History: LOV: 02/26/2016--per [facility]  Family history of melanoma- (-)  Personal history of NMSC or MM- (-)  sunburns easily- (+)  uses sunscreen- (+)  history of tanning bed use- (-)    Physical Examination: Limited    Examination was performed of the following: RUE    Abnormalities noted include: RUE    1. Right axilla with 5 mm pink smooth papule.    A & P:    1. R/O IDN - right axilla shave biopsy  -education  Time out performed.  1% lidocaine with epinephrine injected.  Biopsy performed to the dermal layer of skin and sent to pathology.  Patient educated about scar, s/s of infection, dyspigmentation- written instructions provided to patient in a packet.    Post surgical area may appear red, this is normal. Report any abnormal drainage or odor from site.    Will call with results.

## 2017-07-17 NOTE — Unmapped (Signed)
Spoke with pt regarding results. Benign. If develops more lesions RTC

## 2017-09-23 IMAGING — CT CT HEAD W/O CM
3 of 4 series · 15 of 47 positions shown, 18 images · non-contrast
Comparison: MRI January 29, 2017.

CLINICAL DATA: Migraine headache.

EXAM:
CT HEAD WITHOUT CONTRAST
TECHNIQUE: Contiguous axial images were obtained from the base of the skull
through the vertex without intravenous contrast.

[Series 2: head w/o · axial · non-contrast · 0.45mm/px · z∈[+1400,+1520]mm · 9 of 30 slices shown, 12 images]
[im 3/30  brain]
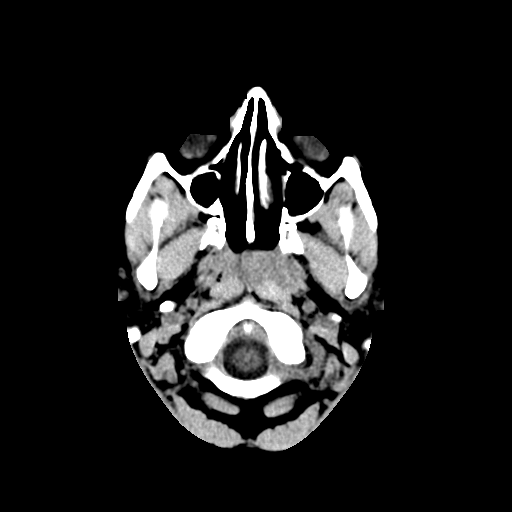
[im 3/30  bone]
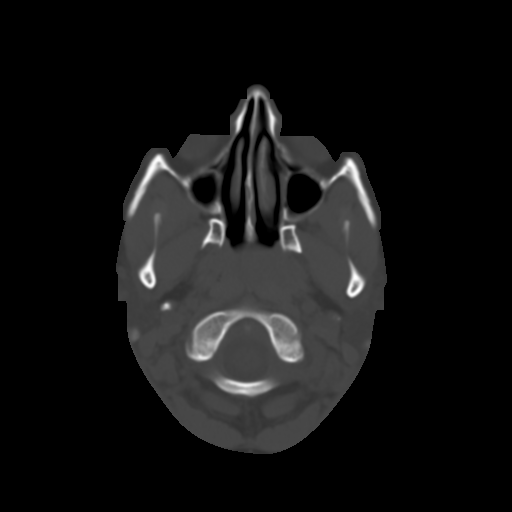
[im 7/30  brain]
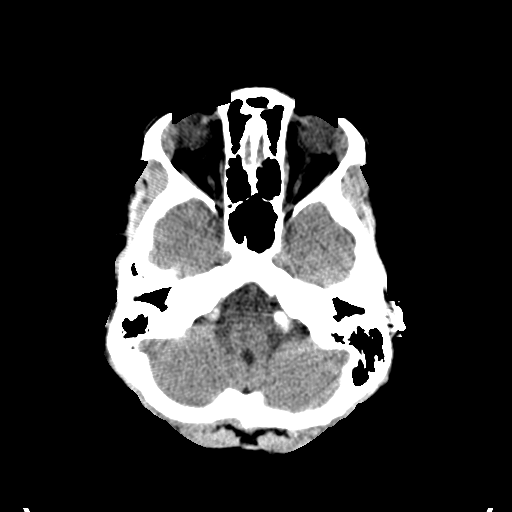
[im 9/30  brain]
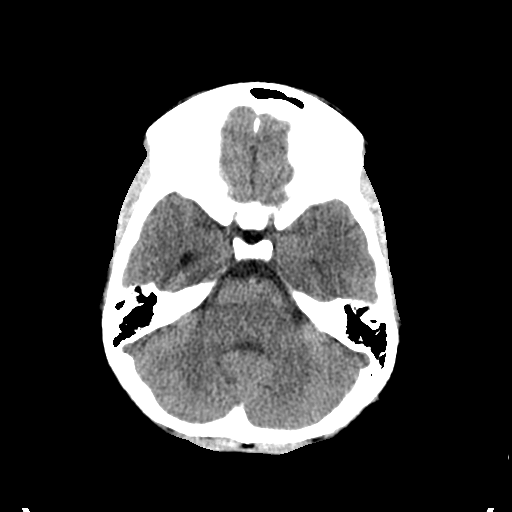
[im 13/30  brain]
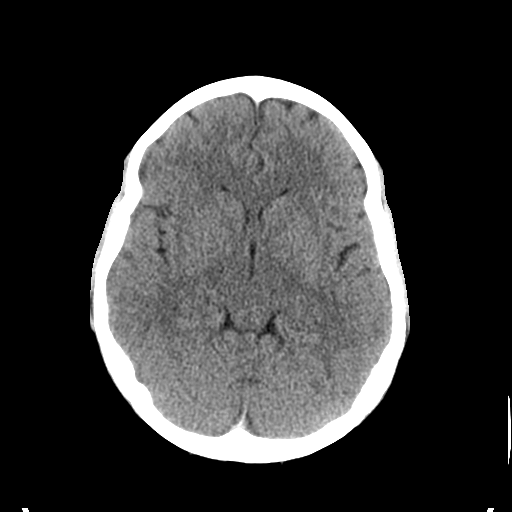
[im 15/30  brain]
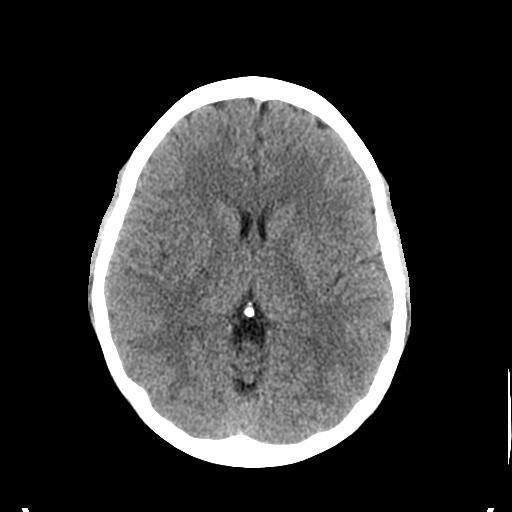
[im 15/30  bone]
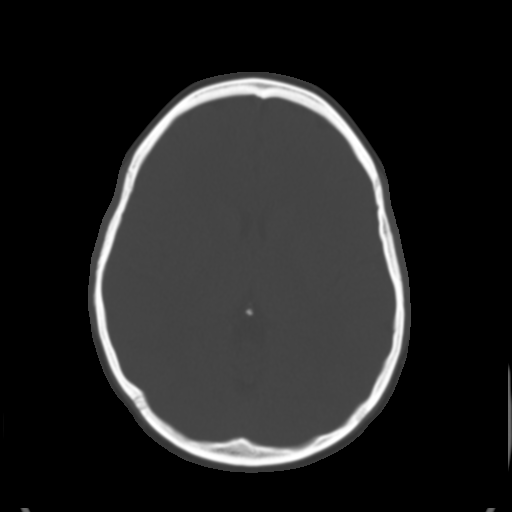
[im 17/30  brain]
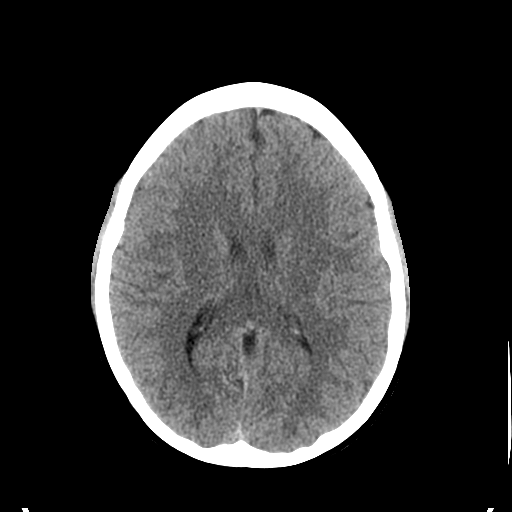
[im 21/30  brain]
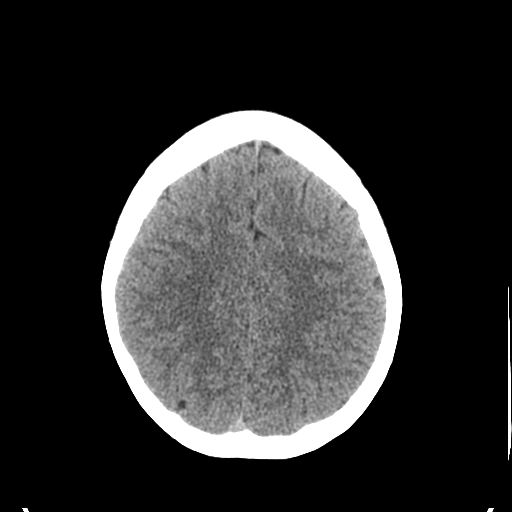
[im 23/30  brain]
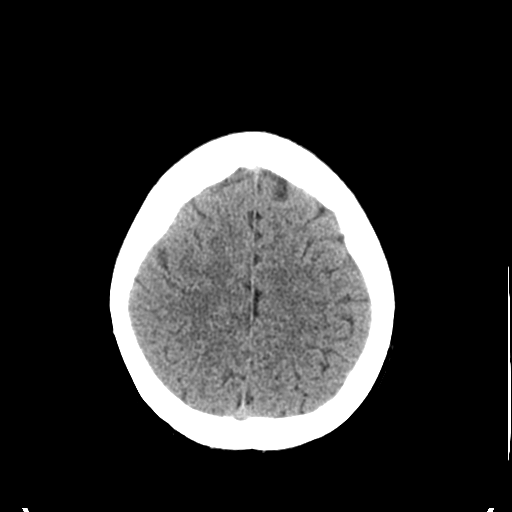
[im 27/30  brain]
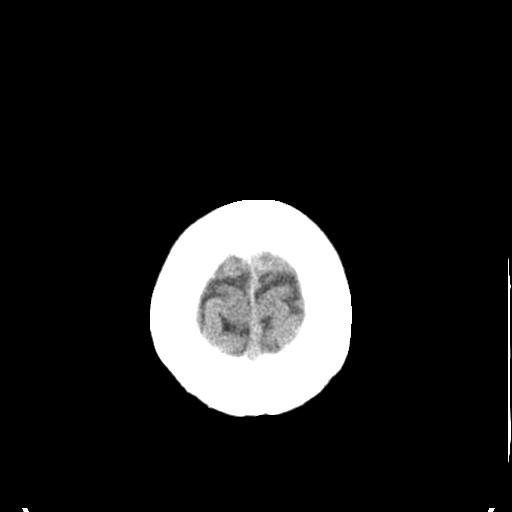
[im 27/30  bone]
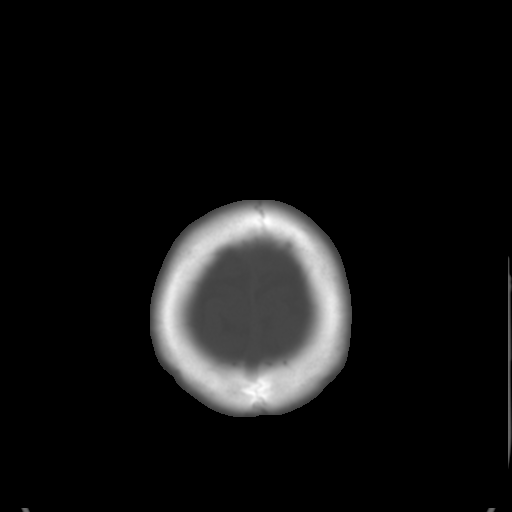

[Series 4: coronal · coronal · 0.29mm/px · 3 of 58 slices shown]
[im 20/58  brain]
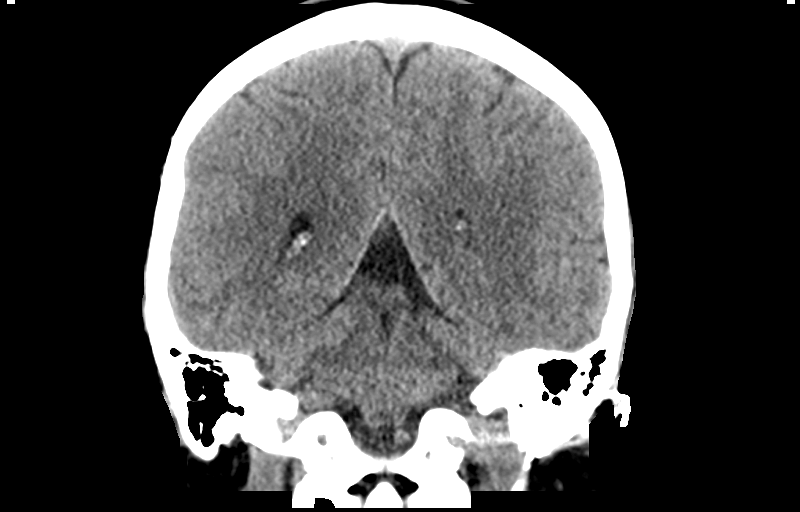
[im 26/58  brain]
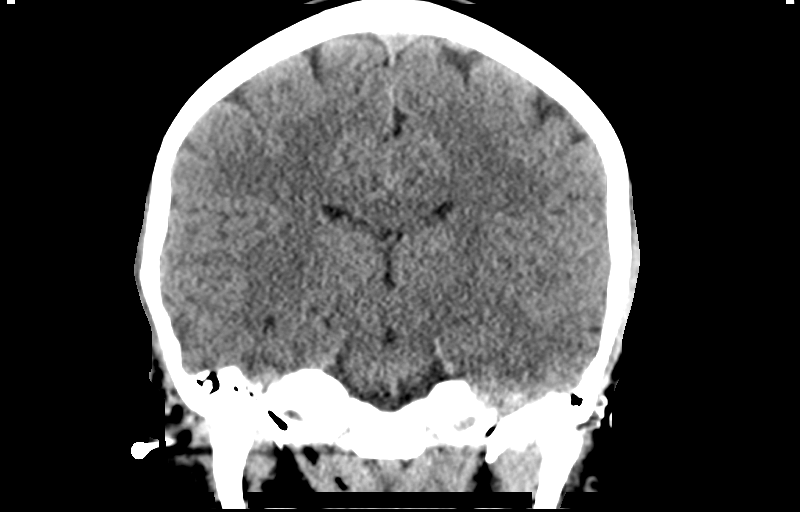
[im 32/58  brain]
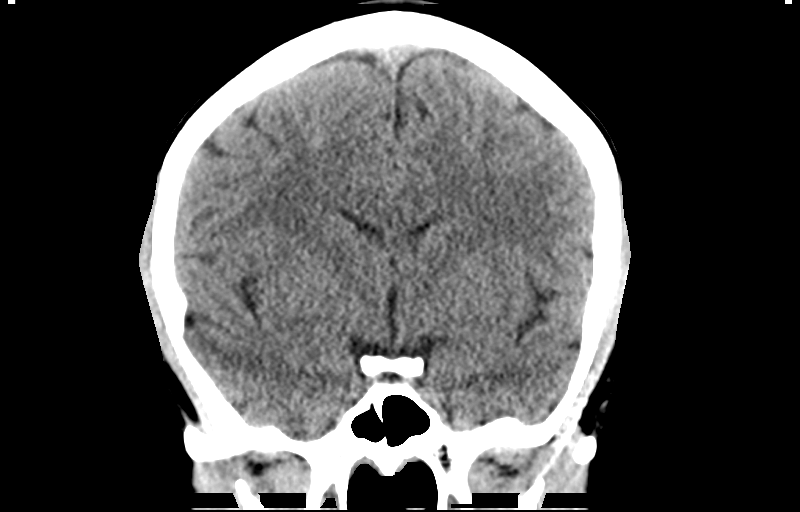

[Series 5: sagittal · sagittal · 0.29mm/px · 3 of 48 slices shown]
[im 16/48  brain]
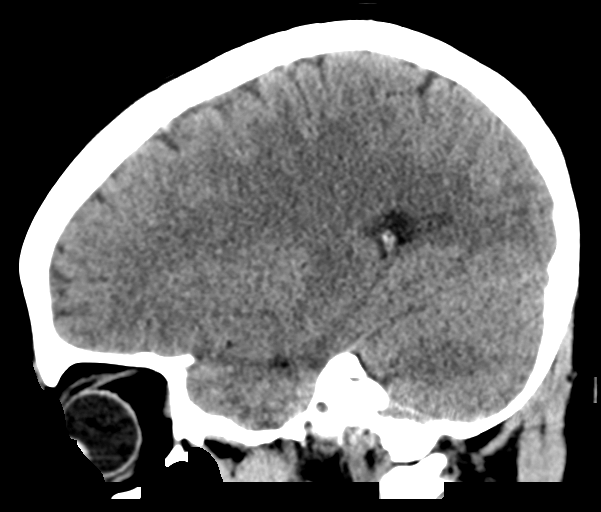
[im 24/48  brain]
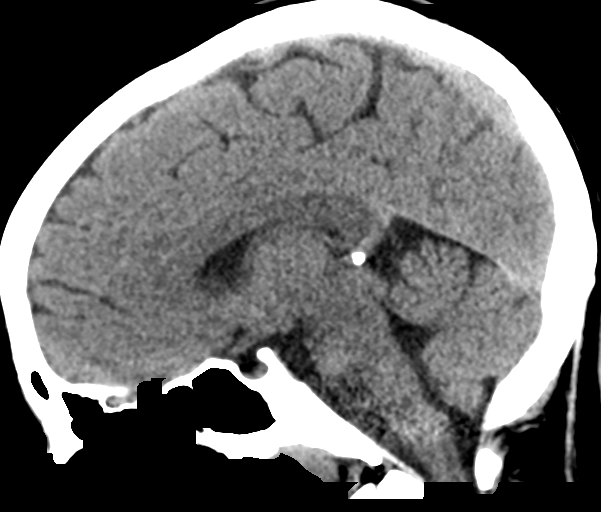
[im 32/48  brain]
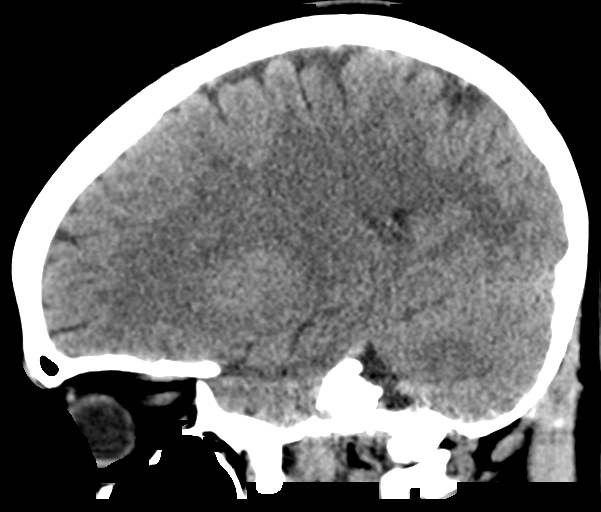

[15 of 47 positions shown; findings below may reference images not displayed]

FINDINGS: Brain: No evidence of acute infarction, hemorrhage, hydrocephalus,
extra-axial collection or mass lesion/mass effect.

Vascular: No hyperdense vessel or unexpected calcification.

Skull: Normal. Negative for fracture or focal lesion.

Sinuses/Orbits: No acute finding.

Other: None.
IMPRESSION: Normal head CT.

## 2018-10-26 ENCOUNTER — Ambulatory Visit: Admit: 2018-10-26 | Discharge: 2018-10-26 | Payer: PRIVATE HEALTH INSURANCE | Attending: Otolaryngic Allergy

## 2018-10-26 DIAGNOSIS — J342 Deviated nasal septum: Secondary | ICD-10-CM

## 2018-10-26 MED ORDER — triamcinolone (NASACORT) 55 mcg nasal inhaler
55 | Freq: Every day | NASAL | 6 refills | 30.00000 days | Status: AC
Start: 2018-10-26 — End: 2019-07-12

## 2018-10-26 NOTE — Unmapped (Signed)
Chief Complaint   Patient presents with   ??? New Patient Visit/ Consultation   ??? sinus pressure   ??? Headache   c/o nasal congestion     History of Present Illness  C/o chronic constant nasal stuffiness with swelling. Tested for allergies in '16, results not recalled but no shots, claritin rec but most antihistamines and Flonase have not helped. 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 She knows she is sensitive to cats, otherwise sxs seem to be chronic and not seasonal. She suffers from migraines, and these have been more severe over past 5 yrs, were daily but now maybe twice a week since starting monthly injections. She is in the process of getting apt with neurology (had been seeing neurologist at school in [place]). Unsure re nasal fx but did play soccer.      Cancer Staging  Cancer Staging  No matching staging information was found for the patient.    Histories  She has no past medical history on file.    She has no past surgical history on file.    Her family history includes Squamous cell carcinoma in her father.    She reports that she has never smoked. She has never used smokeless tobacco. She reports that she does not drink alcohol.    Allergies  Prochlorperazine; Metoclopramide hcl; Metoclopramide; and Prochlorperazine edisylate    Medications  Outpatient Encounter Medications as of 10/26/2018   Medication Sig Dispense Refill   ??? diphenhydrAMINE (BENADRYL) 25 mg capsule 50 mg.     ??? GIANVI, 28, 3-0.02 mg per tablet      ??? loratadine 10 mg Cap 10 mg.     ??? naproxen sodium (ANAPROX) 220 MG tablet 220 mg.     ??? rizatriptan (MAXALT) 10 MG tablet      ??? triamcinolone (KENALOG) 0.1 % cream Apply topically 2 times a day. To red rash on arms and legs as needed. Too strong for face, skin folds 45 g 2   ??? venlafaxine (EFFEXOR-XR) 75 MG 24 hr capsule      ??? VENTOLIN HFA 90 mcg/actuation inhaler        No facility-administered encounter medications on file as of 10/26/2018.          The following portions of the patient's history were reviewed and updated as appropriate: allergies, current medications, past family history, past medical history, past social history, past surgical history and problem list.    Review of Systems:  * See scanned Review of Systems sheet.    Vitals  Blood pressure 112/62, pulse 62, resp. rate 14, height 5' 4 (1.626 m), weight 135 lb (61.2 kg).  Physical Exam  Constitutional:       General: She is not in acute distress.     Appearance: She is well-developed. She is not toxic-appearing or diaphoretic.   HENT:      Head: Normocephalic.      Right Ear: Tympanic membrane, ear canal and external ear normal.      Left Ear: Tympanic membrane, ear canal and external ear normal.      Nose: Septal deviation present.      Right Sinus: No maxillary sinus tenderness.      Left Sinus: No maxillary sinus tenderness.      Comments: Ant septal deviation with turb congestion     Mouth/Throat:      Pharynx: Uvula midline.   Eyes:      General: Lids  are normal.      Conjunctiva/sclera: Conjunctivae normal.   Neck:      Musculoskeletal: Normal range of motion and neck supple.      Thyroid: No thyromegaly.      Trachea: No tracheal deviation.   Pulmonary:      Effort: No respiratory distress.      Breath sounds: No stridor.   Lymphadenopathy:      Head:      Right side of head: No submental, submandibular, preauricular or occipital adenopathy.      Left side of head: No submental, submandibular, preauricular or occipital adenopathy.      Cervical: No cervical adenopathy.   Skin:     General: Skin is warm and dry.      Coloration: Skin is not pale.   Neurological:      Mental Status: She is alert and oriented to person, place, and time.         Lab Review:   No results found for: WBC, HGB, HCT, MCV, PLT, CREATININE, BUN, NA, K, CL, CO2, ALT, AST, GGT, ALKPHOS, BILITOT     Assessment  Nasal obstruction  Dev septum  Allergic rhinitis  Migraine headaches    Plan  Rec nasacort  bid  Allergy testing     Medical Decision Making:  The following items were considered in medical decision making:    Nasal endoscopy:  After the risks and benefits were explained to the patient, verbal consent was given for the procedure.  Nose sprayed with 4% lidocaine with afrin.  After adequate time for anesthesia, nasal endoscopy performed with 30 degree scope.    Pertinent findings:Deviation of septum, R>L. No polyps, no ost disease.  Nasopharynx normal.   Otherwise normal.  The patient tolerated the procedure well without complications.

## 2018-11-16 ENCOUNTER — Ambulatory Visit: Admit: 2018-11-16 | Discharge: 2018-11-16 | Payer: PRIVATE HEALTH INSURANCE

## 2018-11-16 ENCOUNTER — Ambulatory Visit: Admit: 2018-11-16 | Discharge: 2018-11-16 | Payer: PRIVATE HEALTH INSURANCE | Attending: Otolaryngic Allergy

## 2018-11-16 DIAGNOSIS — J309 Allergic rhinitis, unspecified: Secondary | ICD-10-CM

## 2018-11-16 DIAGNOSIS — J3089 Other allergic rhinitis: Secondary | ICD-10-CM

## 2018-11-16 MED ORDER — azelastine (ASTELIN) 137 mcg (0.1 %) nasal spray
137 | Freq: Two times a day (BID) | NASAL | 0 refills | Status: AC
Start: 2018-11-16 — End: 2019-07-12

## 2018-11-16 MED ORDER — EPINEPHrine (AUVI-Q) 0.3 mg/0.3 mL AtIn injection
0.3 | PACK | Freq: Once | INTRAMUSCULAR | 0 refills | 30.00000 days | Status: AC
Start: 2018-11-16 — End: 2018-11-16

## 2018-11-16 NOTE — Progress Notes (Signed)
Chief Complaint   Patient presents with    Allergy Testing   c/o allergies     History of Present Illness  follow-up after allergy testing, results reviewed, primarily severe rx to spring and fall pollen. Nasal steroid did not seem to help her congestion, just made her nose run.      Cancer Staging  Cancer Staging  No matching staging information was found for the patient.    Histories  She has no past medical history on file.    She has no past surgical history on file.    Her family history includes Squamous cell carcinoma in her father.    She reports that she has never smoked. She has never used smokeless tobacco. She reports that she does not drink alcohol.    Allergies  Prochlorperazine; Metoclopramide hcl; Metoclopramide; and Prochlorperazine edisylate    Medications  Outpatient Encounter Medications as of 11/16/2018   Medication Sig Dispense Refill    diphenhydrAMINE (BENADRYL) 25 mg capsule 50 mg.      erenumab-aooe (AIMOVIG AUTO-INJECTOR) 70 mg/mL AtIn Inject subcutaneously.      GIANVI, 28, 3-0.02 mg per tablet       loratadine 10 mg Cap 10 mg.      naproxen sodium (ANAPROX) 220 MG tablet 220 mg.      rizatriptan (MAXALT) 10 MG tablet       triamcinolone (KENALOG) 0.1 % cream Apply topically 2 times a day. To red rash on arms and legs as needed. Too strong for face, skin folds 45 g 2    triamcinolone (NASACORT) 55 mcg nasal inhaler Use 2 sprays into each nostril daily. 1 Bottle 6    venlafaxine (EFFEXOR-XR) 75 MG 24 hr capsule       VENTOLIN HFA 90 mcg/actuation inhaler        No facility-administered encounter medications on file as of 11/16/2018.         The following portions of the patient's history were reviewed and updated as appropriate: allergies, current medications, past family history, past medical history, past social history, past surgical history and problem list.    Review of Systems:  * See scanned Review of Systems sheet.    Vitals  Blood pressure 126/80, pulse 68, resp. rate  14, height 5' 4 (1.626 m), weight 135 lb (61.2 kg).  Physical Exam    Lab Review:   No results found for: WBC, HGB, HCT, MCV, PLT, CREATININE, BUN, NA, K, CL, CO2, ALT, AST, GGT, ALKPHOS, BILITOT     Assessment  Allergies  Nasal congestion    Plan  Rec immunotherapy  Will try Astelin. She does have deviated septum but not severe- recommend see how she responds to allergy treatment.      Medical Decision Making:  The following items were considered in medical decision making:  Permanent chart problem/surgery list reviewed  Permanent chart chronic medication/allergy list reviewed  Permanent chart social/family history reviewed  Review/order other diagnostic or treatment interventions

## 2018-11-16 NOTE — Unmapped (Signed)
Patient:Durham,  Lisa   DOB: Nov 08, 1996            MRN:  [id number]            Physician: Jolyn Lent     The purpose of this allergy and asthma questionnaire is to help your doctor and allergy nurse obtain a thorough allergy history. It is extremely importnt to have an accurate and complete history, so we can compare your daily exposures to symptoms you are having;  with this information we can customize your treatment based on your history, symptoms and testing results.  Please have this questionnaire filled out and bring it with you for your allergy testing.  1. Do you suffer from any skin disorders i.e., Eczema or angioedema? No.  2. Have you ever been told that you have asthma? Yes.  3. Have you ever had an anaphylactic reaction? No.  4. Have you had your tonsils and adenoids removed, or PE tubes placed in your ears? Yes.   5. Are you taking any over the counter medications that have antihistamines in them? No.  6. What is your working/living environment like? i.e., tobacco smoke, moldy, poor filtration etc. Clean.   7. What is your exposure to animals? 4 dogs in the home. Exposed to cats occasionally.  8. 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  9. How old is your home/apartment? 20 years old.  10. Does your home have hardwood floors or carpeting, if carpeting how long has it been there? Yes.  11. Do you have mulch in your yard?  Yes.  12. Do you use a live or artificial Christmas tree? Artificial.  13. Do you have a basement? Yes.  is it finished or unfinished? Unfinished.  14. Have you ever been tested for allergies before? Yes. No allergy immunotherapy.  15. Are your symptoms all the time or at certain times of the year and if so when? _Certain times.  16. Do you wake up in the AM with nasal congestion or headaches? Yes.  17. Do you snore at night? Yes.  18. Do you wake up during the night coughing? No.  19. Does any family members have allergies, asthma or sinus issues? Yes.  20. Do you have any known food  ALLERGIES: No.  21. Do you take any medication called a beta blocker (commonly used for high blood pressure, migraine headaches or glaucoma)? No.  22. What symptoms are you having:  Sinus headaches___ Sinus pressure and facial pain____chronic sinus infections____   Sneezing___Coughing____Post nasal discharge___  Dry itchy eyes   _X__     Watery itchy eyes___ Watery itchy nose____  Ear fullness (ears are plugged, need to pop your ears or feel like you???re under water___X___  Throat clearing___ Reflux (acid reflex) __ the sensation of something caught in your throat__X__  Wheezing_X___  Short of breath__X__          Dry itchy skin____ Rashes__    QUESTIONS FOR PATIENTS WITH ASTHMA  1. Do you take a rescue inhaler more than twice a week______________  2. Are you woken up at night with asthma symptoms more than three times a year___________  3. Do you refill your rescue inhaler more than three times a year_________  4. Is your asthma worse at certain times of the year____________________  5. What are your asthma triggers___________________________________  6. Do you require any oral or injection steroid medication for your asthma__________      [facility]  [address], [address]  [place], [place]   [address]   Patient: Lisa Durham, Mathisen DOB:Aug 09, 1996   MRN: [id number] Physician: Jolyn Lent Date: 11/16/2018    ANTIGEN PRICK #5 #2 END POINT   DUST  MITES 1) D. Farinae 0  5 -    2) D. Pteronyssinus 0  5 -    3) Cockroach 0  5 -    4) Cat, standardized 0  5 -    5) Dog Hair 0  5 -   MOLDS 6) Alternaria 0  5 -    7) Aspergillus  0  5 -    8) Helminthosporium 0  5 -    9) Cladsporium 0  5 -    10) Candida Albicans 0  5 -    11) Penicillium notatum 0  5 -    12) Fusarium  0  7 3    13) Epiccocum 0  5 -    14) Pullularia 0  5 -    15) Rhizopus 0  5 -   TREES  (Feb-May) 16) Cedar, Red 0  5 -    17) Birch, Red (River) 0  7 3    18) Ash, White 9  - 6    19) Pecan/Hickory 0  5 -    20) Elm,  American 0  5 -    21) Box elder/Maple 9  - 6    22) Oak, Northern Red 0  5 -    23) Cottonwood 0  5 -   WEEDS  (July-Sep) 24) English Plantain 9  - 6    25) Ragweed Short/Giant 9  - 6    26) Lambs quarter 9  - 6    27) Pigweed 9  - 6    28)Sorrel 9  - 6    29)Wormwood 9  - 6   GRASSES  (Ap-Sep) 30) Johnson grass 13+  - 6    31) Timothy grass 13+  - 6    32) [place] Grass 13+  - 6   + control HISTAMINE   9       - control GLYCERIN   0        Consent acquired for allergy testing. Explained the allergy testing process. Questions and concerns addressed prior to starting allergy testing. Patient accompanied by mom during testing.    Noted that the patient is allergic to grass pollen. Encouraged the patient to try to medicate with antihistamines at least 2 hours prior to mowing the lawn. Also noted that it is a good idea to wear a tight fitting mask and eye protection when mowing the lawn.    Nurse also stated that the patient should shower right away after mowing the lawn and to not sit on furniture and or lay in bed after mowing.     Nurse provided the UC Concomitant Food List to the patient during the visit. Encouraged the patient to take note the pollens they're allergic to, because some pollens tend to cross react with the foods they eat.   ??  Advised the patient to keep the list of concomitant foods on the refrigerator door so that they may have something to use as a reference if they start to have symptoms with a certain food. Nurse also noted the different months that the pollens pollinate in. The patient voiced understanding of these recommendations.    Education regarding immunotherapy was also provided. Nurse stated that allergy immunotherapy takes time to take effect. Noted that individuals  immune system are different and take time to build immunity to the antigens. The time can vary from patient to patient. The patient voiced understanding of this concept.  ??  Emphasized the difference between SCIT and SLIT.  Noted that with SCIT the patient has to build up slowly in order to not cause an anaphylactic reaction. Nurse stated that the patient  take injections weekly to build up to concentrate. Nurse noted that with SLIT patients are automatically on concentrate and that the drops can be taken home. Nursed noted that the drops are a daily dose.  The downside with this, as explained to the patient, is that the SLIT is not covered by insurance but SCIT is. Advised patient to check with their insurance on what their out of pocket will be for the SCIT.The patient voiced understanding of this.  ??    ??

## 2018-11-17 NOTE — Unmapped (Signed)
[phone] (M). Left voicemail and encouraged the patient to call my direct line to notify staff if she wants to do allergy drops or shots. Direct line # provided.

## 2018-11-19 NOTE — Unmapped (Signed)
[facility]  ALLERGY TREATMENT VIAL WORKSHEET ENVIRONMENTAL  NAME: Lisa Durham, Lisa Durham DOB: 02-Sep-1996 DR. SEIDEN DATE: 11/19/2018  EXTRACT#1 START DATE: 11/2018  MOLDS End Point: [name] Dilution: Volume: ml   Fusarium Oxysporium 3 Not treated ---   EXTRACT#2  TREES      Birch 3 Not treated ---   Ash 6 4 0.20   Box Elder 6 4 0.20   GRASSES      Johnson Grass 6 5 0.20   Timothy Grass 6 5 0.20   [place] Grass 6 5 0.20   WEEDS      English Plantain 6 4 0.20   Ragweed 6 4 0.20   Lambs quarter 6 4 0.20   Pigweed 6 4 0.20   Sorrel 6 Not treated ---   Wormwood 6 4 0.20   Total Volume of Antigens: 2.31ml   Total Volume of Diluent: 2.15ml   Total Volume of Glycerin: 1.32ml   Total Volume: 5.33ml   Prepared by: G.Myeesha Shane RN 11/19/2018     Charges generated on paper invoice.    Extract lot numbers [place], [place]: [id number] EXP: 06/17/2021  Box Elder/Maple: [id number] EXP: 07/29/2021    English Plantain: [id number] EXP: 12/03/2020  Ragweed Short/Giant: [id number] EXP: 12/17/2020  Lambs Quarter: [id number] EXP: 07/29/2021  Pigweed: [id number] EXP: 07/08/2020   Wormwood: [id number] EXP: 02/15/2021    Lisa Durham: [id number] EXP: 05/06/2021  Lisa Durham: [id number] EXP: 07/26/2020  [place] Grass: [id number] EXP: 06/23/2021    Glycerin: [id number] EXP: 11/2022

## 2018-11-30 NOTE — Unmapped (Signed)
[phone] (M). Left voicemail and notified the patient that her allergy vials are ready. Encouraged the patient to contact the [facility] ENT office to schedule his first injection appointment.

## 2018-12-03 ENCOUNTER — Ambulatory Visit: Payer: PRIVATE HEALTH INSURANCE

## 2018-12-03 ENCOUNTER — Institutional Professional Consult (permissible substitution)
Admit: 2018-12-03 | Discharge: 2018-12-03 | Payer: PRIVATE HEALTH INSURANCE | Attending: Clinical & Laboratory Immunology

## 2018-12-03 DIAGNOSIS — J3089 Other allergic rhinitis: Secondary | ICD-10-CM

## 2018-12-03 NOTE — Unmapped (Signed)
Patient is here to have their first environmental allergy injection. Proper instructions were reviewed and discussed with the patient.  Patient is in possession of Epi Pen and demonstrates good understanding of how and when to use it.  The first injectionl was administered during the visit..The patient remained in the waiting room for 20 minutes to make sure there were no reactions.  None were noted.

## 2018-12-11 ENCOUNTER — Institutional Professional Consult (permissible substitution): Admit: 2018-12-11 | Discharge: 2018-12-11 | Payer: PRIVATE HEALTH INSURANCE

## 2018-12-11 DIAGNOSIS — J3089 Other allergic rhinitis: Secondary | ICD-10-CM

## 2018-12-11 NOTE — Unmapped (Signed)
Extract #1  Dose: 0.10 ml  Site: right arm  Administered by: G. Natale Thoma RN    No noted reactions from the previous injection. The patient denies any fever or any other symptoms from the last 24 hours.

## 2018-12-18 ENCOUNTER — Institutional Professional Consult (permissible substitution): Admit: 2018-12-18 | Discharge: 2018-12-18 | Payer: PRIVATE HEALTH INSURANCE

## 2018-12-18 DIAGNOSIS — J3089 Other allergic rhinitis: Secondary | ICD-10-CM

## 2018-12-18 NOTE — Unmapped (Signed)
Extract #1  Dose: 0.15 ml  Site: left arm  Administered by: G. Montez Cuda RN    No noted reactions from the previous injection. The patient denies any fever or any other symptoms from the last 24 hours.

## 2018-12-25 ENCOUNTER — Institutional Professional Consult (permissible substitution): Admit: 2018-12-25 | Discharge: 2018-12-25 | Payer: PRIVATE HEALTH INSURANCE

## 2018-12-25 DIAGNOSIS — J3089 Other allergic rhinitis: Secondary | ICD-10-CM

## 2018-12-25 NOTE — Progress Notes (Signed)
Extract #1  Dose: 0.20 ml  Site: right arm  Administered by: Trudee Grip RN    No noted reactions from the previous injection. The patient denies any fever or any other symptoms from the last 24 hours.

## 2019-01-01 ENCOUNTER — Institutional Professional Consult (permissible substitution)
Admit: 2019-01-01 | Discharge: 2019-01-01 | Payer: PRIVATE HEALTH INSURANCE | Attending: Clinical & Laboratory Immunology

## 2019-01-01 DIAGNOSIS — J3089 Other allergic rhinitis: Secondary | ICD-10-CM

## 2019-01-01 NOTE — Unmapped (Signed)
Patient hasn't had any reactions from their allergy injections. Doing well with their Immunotherapy. Humidification and hydration guidelines were discussed. Patient was advised to continue their nasal rinsing and stay inside when mold counts were high. Patient was shone their allergy vials to verify their name and date of birth.

## 2019-01-08 ENCOUNTER — Institutional Professional Consult (permissible substitution)
Admit: 2019-01-08 | Discharge: 2019-01-08 | Payer: PRIVATE HEALTH INSURANCE | Attending: Clinical & Laboratory Immunology

## 2019-01-08 DIAGNOSIS — J3089 Other allergic rhinitis: Secondary | ICD-10-CM

## 2019-01-08 NOTE — Unmapped (Signed)
Patient hasn't had any reactions from their allergy injections. Doing well with their Immunotherapy. Humidification and hydration guidelines were discussed. Patient was advised to continue their nasal rinsing and stay inside when mold counts were high. Patient was shone their allergy vials to verify their name and date of birth.

## 2019-01-11 ENCOUNTER — Ambulatory Visit: Admit: 2019-01-11 | Discharge: 2019-01-11 | Payer: PRIVATE HEALTH INSURANCE | Attending: Otolaryngic Allergy

## 2019-01-11 DIAGNOSIS — J342 Deviated nasal septum: Secondary | ICD-10-CM

## 2019-01-11 MED ORDER — azelastine-fluticasone (DYMISTA) 137-50 mcg/spray Spry
137-50 | Freq: Two times a day (BID) | NASAL | 6 refills | Status: AC
Start: 2019-01-11 — End: 2019-07-12

## 2019-01-11 NOTE — Unmapped (Signed)
Chief Complaint   Patient presents with   ??? Follow-up     trouble breathing by nose   c/o allergies     History of Present Illness  Seen after nearly 1.5 months of immunotherapy. No significant improvement in symptoms yet. Feels congestion just inside nose near nasal bridge. No significant PND, rhinorrhea, just having congestive symptoms. Still using asteline spray daily.      Cancer Staging  Cancer Staging  No matching staging information was found for the patient.    Histories  She has no past medical history on file.    She has no past surgical history on file.    Her family history includes Squamous cell carcinoma in her father.    She reports that she has never smoked. She has never used smokeless tobacco. She reports that she does not drink alcohol.    Allergies  Prochlorperazine; Metoclopramide hcl; Metoclopramide; and Prochlorperazine edisylate    Medications  Outpatient Encounter Medications as of 01/11/2019   Medication Sig Dispense Refill   ??? erenumab-aooe (AIMOVIG AUTO-INJECTOR) 70 mg/mL AtIn Inject subcutaneously.     ??? GIANVI, 28, 3-0.02 mg per tablet      ??? azelastine (ASTELIN) 137 mcg (0.1 %) nasal spray Use 1 spray into each nostril 2 times a day. Use in each nostril as directed 1 Bottle 0   ??? diphenhydrAMINE (BENADRYL) 25 mg capsule 50 mg.     ??? loratadine 10 mg Cap 10 mg.     ??? naproxen sodium (ANAPROX) 220 MG tablet 220 mg.     ??? rizatriptan (MAXALT) 10 MG tablet      ??? triamcinolone (KENALOG) 0.1 % cream Apply topically 2 times a day. To red rash on arms and legs as needed. Too strong for face, skin folds 45 g 2   ??? triamcinolone (NASACORT) 55 mcg nasal inhaler Use 2 sprays into each nostril daily. 1 Bottle 6   ??? venlafaxine (EFFEXOR-XR) 75 MG 24 hr capsule      ??? VENTOLIN HFA 90 mcg/actuation inhaler        No facility-administered encounter medications on file as of 01/11/2019.         The following portions of the patient's history were reviewed and updated as appropriate: allergies, current  medications, past family history, past medical history, past social history, past surgical history and problem list.    Review of Systems:  * See scanned Review of Systems sheet.    Vitals  Height 5' 4 (1.626 m), weight 159 lb (72.1 kg), last menstrual period 12/28/2018.  General: alert oriented no distress  Face: normal facies, no abnormalities  Nose: external nose narrow but without abnormalities, grossly midline. Improved nasal breathing with coddle maneuver   Anterior rhinoscopy: anterior septal deflection to the left. No polyps. Bilateral inferior turbinate hypertrophy   Neck: trachea midline, no LAD, no masses    Lab Review:   No results found for: WBC, HGB, HCT, MCV, PLT, CREATININE, BUN, NA, K, CL, CO2, ALT, AST, GGT, ALKPHOS, BILITOT     Assessment  Allergies  Nasal congestion  Nasal septal deviation  Narrow internal nasal valves, dynamic valve collapse     Plan  Plan to continue immunotherapy. Would not expect significant improvement of symptoms this early into treatment  Will order dymista spray for dual steroid, antihistamine effect  Referral to Dr. Alveria Apley for evaluation of possible OSR for septal deviation and narrow internal nasal valves

## 2019-01-11 NOTE — Unmapped (Signed)
Nasal endoscopy:  After the risks and benefits were explained to the patient, verbal consent was given for the procedure.  Nose sprayed with 4% lidocaine with afrin.  After adequate time for anesthesia, nasal endoscopy performed with 30 degree scope.    Pertinent findings:Left septal deviation,   Nasopharynx with exudate and mucoid drainage over adenoid pad.    Otherwise normal.  The patient tolerated the procedure well without complications.

## 2019-01-15 ENCOUNTER — Institutional Professional Consult (permissible substitution): Admit: 2019-01-15 | Discharge: 2019-01-15 | Payer: PRIVATE HEALTH INSURANCE

## 2019-01-15 DIAGNOSIS — J3089 Other allergic rhinitis: Secondary | ICD-10-CM

## 2019-01-15 NOTE — Unmapped (Signed)
Extract #1  Dose: 0.35 ml  Site: left arm  Administered by: Trudee Grip RN    No noted reactions from the previous injection. The patient denies any fever or any other symptoms from the last 24 hours.

## 2019-01-22 ENCOUNTER — Institutional Professional Consult (permissible substitution): Admit: 2019-01-22 | Discharge: 2019-01-22 | Payer: PRIVATE HEALTH INSURANCE

## 2019-01-22 DIAGNOSIS — J3089 Other allergic rhinitis: Secondary | ICD-10-CM

## 2019-01-22 NOTE — Unmapped (Signed)
Extract #1: Escalating: TR, GR, W  Dose: 0.40 ml  Site: right arm  Administered by: Trudee Grip RN    No noted reactions from the previous injection. The patient denies any fever or any other symptoms from the last 24 hours.

## 2019-01-29 ENCOUNTER — Institutional Professional Consult (permissible substitution)
Admit: 2019-01-29 | Discharge: 2019-01-29 | Payer: PRIVATE HEALTH INSURANCE | Attending: Clinical & Laboratory Immunology

## 2019-01-29 DIAGNOSIS — J3089 Other allergic rhinitis: Secondary | ICD-10-CM

## 2019-01-29 NOTE — Unmapped (Signed)
Patient hasn't had any reactions from their allergy injections. Doing well with their Immunotherapy. Humidification and hydration guidelines were discussed. Patient was advised to continue their nasal rinsing and stay inside when mold counts were high. Patient was shone their allergy vials to verify their name and date of birth.

## 2019-01-29 NOTE — Unmapped (Signed)
[facility]  ALLERGY TREATMENT VIAL WORKSHEET ENVIRONMENTAL  NAME: Lisa Durham, Lisa Durham DOB: November 04, 1996 DR. SEIDEN DATE: 01/29/2019  EXTRACT#1 START DATE: 11/2018  MOLDS End Point: [name] Dilution: Volume: ml   Fusarium Oxysporium 3 Not treated ---   EXTRACT#2  TREES      Birch 3 Not treated ---   Ash 6 3 0.20   Box Elder 6 3 0.20   GRASSES      Johnson Grass 6 4 0.20   Timothy Grass 6 4 0.20   [place] Grass 6 4 0.20   WEEDS      English Plantain 6 3 0.20   Ragweed 6 3 0.20   Lambs quarter 6 3 0.20   Pigweed 6 3 0.20   Sorrel 6 Not treated ---   Wormwood 6 3 0.20   Total Volume of Antigens: 2.76ml   Total Volume of Diluent: 2.42ml   Total Volume of Glycerin: 1.54ml   Total Volume: 5.52ml   Prepared by: G.Layce Sprung RN 01/29/2019     Charges generated on paper invoice.    Extract lot numbers [place], [place]: [id number] EXP: 06/17/2021  Box Elder/Maple: [id number] EXP: 07/29/2021    English Plantain: [id number] EXP: 12/03/2020  Ragweed Short/Giant: [id number] EXP: 12/17/2020  Lambs Quarter: [id number] EXP: 07/29/2021  Pigweed: [id number] EXP: 07/08/2020   Wormwood: [id number] EXP: 02/15/2021    Corlis Leak: [id number] EXP: 05/06/2021  Juliann Mule: [id number] EXP: 07/26/2020  [place] Grass: [id number] EXP: 06/23/2021    Glycerin: [id number] EXP: 11/2022

## 2019-02-05 ENCOUNTER — Institutional Professional Consult (permissible substitution)
Admit: 2019-02-05 | Discharge: 2019-02-05 | Payer: PRIVATE HEALTH INSURANCE | Attending: Clinical & Laboratory Immunology

## 2019-02-05 DIAGNOSIS — J3089 Other allergic rhinitis: Secondary | ICD-10-CM

## 2019-02-05 NOTE — Unmapped (Signed)
Patient hasn't had any reactions from their allergy injections. Doing well with their Immunotherapy. Humidification and hydration guidelines were discussed. Patient was advised to continue their nasal rinsing and stay inside when mold counts were high. Patient was shone their allergy vials to verify their name and date of birth.

## 2019-02-08 ENCOUNTER — Ambulatory Visit: Admit: 2019-02-08 | Discharge: 2019-02-08 | Payer: PRIVATE HEALTH INSURANCE

## 2019-02-08 DIAGNOSIS — J3489 Other specified disorders of nose and nasal sinuses: Secondary | ICD-10-CM

## 2019-02-08 NOTE — Unmapped (Signed)
HISTORY  23 yo female presents with complaints of nasal obstruction. She has a known history of allergic rhinitis, testing positive for spring, summer, and fall environmental pollens, currently on SCIT starting this most recent January.  She is followed by Dr. Jolyn Lent who additionally has noted left septal deviation and internal valve narrowing as likely contributing to her obstructive symptoms. She has tried nasal corticosteroids without significant benefit.  She is also interested in reducing her dorsal hump at the time of functional nasal surgery.  She also has asthma.  She notes history of nosebleeds for her entire life since she was an infant. They can sometimes be significant and take up to 30 minutes to control     I reviewed the Past Medical History, Past Surgical History, Medications, Allergies, Family History, Review of Systems, Social History which has been captured in the signed patient intake form and scanned into the medical record under the 'Media' tab.    PHYSICAL EXAM  General Appearance: well-developed, well-nourished and in no acute distress     Communication: I was able to converse well with the patient. Patient was able to answer questions adequately and appropriately.     Head & Face (general): Normal appearing without masses, lesions or disfigurement     Face (palpation): no sinus tenderness     Salivary Glands (palpation): salivary glands NL size.     Voice Quality: Normal     Eyes:   External: No masses or abnormality   Extraoccular Muscle: intact     Nose:   External: external nose with dorsal hump most pronounced and bony-cartilaginous junction,  Internal: anterior rhinoscopy performed. Septal deviation to the left with internal valve collapse with upper lat visualized against septum. Enlarged turbinates.  Right internal valve narrowing. Clear rhinorrhea noted.  Improvement in breathing with modified cottle maneuver    Ears:   External Ears: external ears are of normal appearance. No  masses, lesions or scars.   House-Brackman Grading System for Facial Nerve Dysfunction:   (Left) Grade 1: Normal movement   (Right) Grade 1: Normal movement     Mouth:   Lip/teeth/gums: healthy dentition, lips, teeth and gums in good condition. no gingival inflammation, no labial lesions. Mucosa: No leukoplakia or masses. Hard/soft palates and tongue of NL symmetry.   Tongue: normal midline, normal mucosa     Oropharynx/ Tonsils: pharyngeal walls and tonsillar fossae without abnormalities.     Neck:   Neck Exam: no cervical, supraclavicular or auricular adenopathy   Thyroid Exam: no masses or fullness of thyroid     Respiratory Inspection:   Respiratory Effort: breathing comfortably, no increased work of breathing.     Cardiovascular:   Carotid arteries: pulses 2+, symmetric, no bruits   Skin:   Inspection: no lower extremity edema, rashes, lesions, or ulcerations, well developed, turgor intact     Musculoskeletal:   Gait and Station: intact without difficulty     Neurologic:   Cranial Nerves: II - XII grossly intact, upper extremities-normal strength, lower extremities-normal strength, deep tendon reflexes-normal, cerebellar exam-normal.     Mental Status:   Orientation: oriented to time, place, and person   Mood and affect: NL mood and affect. A+O x 4.    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 Interested in dorsal hump reduction. Vectra images performed and discussed with patient    PLAN  Discussed OSR in detail with patient including  recovery process and expectations for aesthetic outcome. Primary concern is breathing and function. We discussed that this surgery will not affect her allergic rhinitis and that she will continue to have symptoms of this and likely continue to require treatment for it.    Will plan for spreader graft placement, turbinate reduction, and septoplasty.  Plan for dorsal hump  reduction with likely osteotomies required.  Will then deproject the nose and slightly increase tip rotation.  Review vectra imaging.

## 2019-02-12 ENCOUNTER — Institutional Professional Consult (permissible substitution)
Admit: 2019-02-12 | Discharge: 2019-02-12 | Payer: PRIVATE HEALTH INSURANCE | Attending: Clinical & Laboratory Immunology

## 2019-02-12 DIAGNOSIS — J3089 Other allergic rhinitis: Secondary | ICD-10-CM

## 2019-02-12 NOTE — Unmapped (Signed)
Patient hasn't had any reactions with their allergy injections, and is doing well with their immunotherapy. I reminded them that the pollens are now showing in the air quality, so they need to start keeping track of the pollens counts, keep up their nasal rinsing and stay inside when the pollen counts are high. Pt. Was shone the allergy vials and they verified the date of birth and name was theirs.Patient advised to wait in the waiting room for 20 minutes before leaving office.

## 2019-02-18 NOTE — Unmapped (Signed)
COVID-19 Screen:    1) Do you have a fever? No.  If yes,   - What has your fever been?   - How long have you had it?   - Have you taken any medication to treat the fever?     2) Do you have a new or worsened cough? No.  If yes,   - How would you describe your cough?   - How long has it been going on?   - Have you taken any medication to treat the cough?     3) Do you have new or worsened shortness of breath? No.  - How long has it been going on?   - Have you taken any medication to relieve your shortness of breath?     4) Has the patient traveled to [place], [place], [place], [place], or [place] within the last 14 days? No.    5) Has the patient had close contact with someone with a confirmed case of COVID-19 in the last 14 days? No.     COVID-19 SCREEN   Screen positive if pt answers YES to 1, 2, 3 AND question 4 OR 5  If screen positive - escalate with a call to the clinic*    If screen negative - route per protocol      ADDITIONAL INFORMATION:

## 2019-02-19 ENCOUNTER — Institutional Professional Consult (permissible substitution): Admit: 2019-02-19 | Discharge: 2019-02-19 | Payer: PRIVATE HEALTH INSURANCE | Attending: Allergy

## 2019-02-19 DIAGNOSIS — J3089 Other allergic rhinitis: Secondary | ICD-10-CM

## 2019-02-19 NOTE — Unmapped (Signed)
Dr seiden   Afebrile       Extract #1  Dose: 0.05 ml  Site: left arm  Administered by: C. Brodrick Curran RN      No noted reactions from the previous injection. The patient denies any fever or any other symptoms from the last 24 hours.

## 2019-02-26 ENCOUNTER — Institutional Professional Consult (permissible substitution): Admit: 2019-02-26 | Discharge: 2019-02-26 | Payer: PRIVATE HEALTH INSURANCE

## 2019-02-26 DIAGNOSIS — J3089 Other allergic rhinitis: Secondary | ICD-10-CM

## 2019-02-26 NOTE — Unmapped (Signed)
Extract #1: Escalating: TR, GR, W  Dose: 0.10 ml  Site: left arm  Administered by: Trudee Grip RN    No noted reactions from the previous injection. The patient denies any fever or any other symptoms from the last 24 hours.     T: 97.3 F

## 2019-03-02 ENCOUNTER — Institutional Professional Consult (permissible substitution): Admit: 2019-03-02 | Discharge: 2019-03-02 | Payer: PRIVATE HEALTH INSURANCE

## 2019-03-02 DIAGNOSIS — J3089 Other allergic rhinitis: Secondary | ICD-10-CM

## 2019-03-02 NOTE — Unmapped (Signed)
Extract #1: Escalating: TR, GR, W  Dose: 0.15  Site: right arm  Administered by: Trudee Grip RN    No noted reactions from the previous injection. The patient denies any fever or any other symptoms from the last 24 hours.     T: 98.1 F

## 2019-03-09 ENCOUNTER — Institutional Professional Consult (permissible substitution): Admit: 2019-03-09 | Discharge: 2019-03-09 | Payer: PRIVATE HEALTH INSURANCE

## 2019-03-09 DIAGNOSIS — J3089 Other allergic rhinitis: Secondary | ICD-10-CM

## 2019-03-09 NOTE — Unmapped (Signed)
Extract #1: Escalating: TR, GR, W  Dose: 0.20 ml  Site: left arm  Administered by: Trudee Grip RN    No noted reactions from the previous injection. The patient denies any fever or any other symptoms from the last 24 hours.

## 2019-03-11 NOTE — Unmapped (Signed)
Spoke to the patient and inquired about the local reaction that she experienced from her past allergy injection. Pt noted that there were no respiratory symptoms. Noted that the local reaction occurred hours after leaving the office and the patient did state that she did some outdoor activities the day that she got her injection.     Nurse noted that the dose will be decreased for safety for her next allergy injection. Also encouraged the patient to take an antihistamine at least 2 hours before the injection or after getting the injection. Pt voiced clear understanding of this. Also noted that she can ice the affected area intermittently.    Provided direct line for call back for questions or concerns.

## 2019-03-16 ENCOUNTER — Institutional Professional Consult (permissible substitution): Admit: 2019-03-16 | Discharge: 2019-03-16 | Payer: PRIVATE HEALTH INSURANCE

## 2019-03-16 DIAGNOSIS — J3089 Other allergic rhinitis: Secondary | ICD-10-CM

## 2019-03-16 NOTE — Unmapped (Signed)
Extract #1: Escalating: TR, GR, W  Dose: 0.20 ml  Site: right arm  Administered by: Trudee Grip RN    Pt noted a large local reaction from the previous injection. No respiratory symptoms noted. Will repeat dose and may have to dilute vial if reaction occurs again.

## 2019-03-16 NOTE — Unmapped (Signed)
Patient scheduled for surgery on 07/15/19 at 9:15am, arrival time 715am at [facility].  Post op scheduled for 07/23/19 at 8:10am at [facility] office.  Patient will see PCP for pre-op.  Mailed out surgery packet to patient.

## 2019-03-23 ENCOUNTER — Institutional Professional Consult (permissible substitution): Admit: 2019-03-23 | Discharge: 2019-03-23 | Payer: PRIVATE HEALTH INSURANCE

## 2019-03-23 DIAGNOSIS — J3089 Other allergic rhinitis: Secondary | ICD-10-CM

## 2019-03-23 NOTE — Unmapped (Signed)
Extract #1: Escalating: TR, GR, W  Dose: 0.25 ml  Site: left arm  Administered by: Trudee Grip RN    No noted reactions from the previous injection.

## 2019-03-30 ENCOUNTER — Institutional Professional Consult (permissible substitution): Admit: 2019-03-30 | Discharge: 2019-03-30 | Payer: PRIVATE HEALTH INSURANCE

## 2019-03-30 DIAGNOSIS — J3089 Other allergic rhinitis: Secondary | ICD-10-CM

## 2019-03-30 NOTE — Unmapped (Signed)
Extract #1: Escalating: TR, GR, W  Dose: 0.30 ml  Site: right arm  Administered by: Trudee Grip RN    No noted reactions from the previous injection.

## 2019-04-06 ENCOUNTER — Institutional Professional Consult (permissible substitution): Admit: 2019-04-06 | Discharge: 2019-04-06 | Payer: PRIVATE HEALTH INSURANCE

## 2019-04-06 DIAGNOSIS — J3089 Other allergic rhinitis: Secondary | ICD-10-CM

## 2019-04-06 NOTE — Unmapped (Signed)
Extract #1: Escalating: TR, GR, W  Dose: 0.35 ml  Site: left arm  Administered by: Trudee Grip RN    No noted reactions from the previous injection.

## 2019-04-13 ENCOUNTER — Institutional Professional Consult (permissible substitution)
Admit: 2019-04-13 | Discharge: 2019-04-13 | Payer: PRIVATE HEALTH INSURANCE | Attending: Clinical & Laboratory Immunology

## 2019-04-13 DIAGNOSIS — J3089 Other allergic rhinitis: Secondary | ICD-10-CM

## 2019-04-13 NOTE — Unmapped (Signed)
Patient hasn't had any reactions with their allergy injections, and is doing well with their immunotherapy. I reminded them that the pollens are now showing in the air quality, so they need to start keeping track of the pollens counts, keep up their nasal rinsing and stay inside when the pollen counts are high. Pt. Was shone the allergy vials and they verified the date of birth and name was theirs.Patient advised to wait in the waiting room for 20 minutes before leaving office.

## 2019-04-20 ENCOUNTER — Institutional Professional Consult (permissible substitution): Admit: 2019-04-20 | Discharge: 2019-04-20 | Payer: PRIVATE HEALTH INSURANCE

## 2019-04-20 DIAGNOSIS — J3089 Other allergic rhinitis: Secondary | ICD-10-CM

## 2019-04-20 NOTE — Unmapped (Signed)
Extract #1: Escalating: TR, GR, W  Dose: 0.45 ml  Site: left arm  Administered by: Trudee Grip RN    No noted reactions from the previous injection.

## 2019-04-27 ENCOUNTER — Institutional Professional Consult (permissible substitution): Admit: 2019-04-27 | Discharge: 2019-04-27 | Payer: PRIVATE HEALTH INSURANCE

## 2019-04-27 DIAGNOSIS — J3089 Other allergic rhinitis: Secondary | ICD-10-CM

## 2019-04-27 NOTE — Unmapped (Signed)
Extract #1: Escalating: TR, GR, W  Dose: 0.50 ml  Site: right arm  Administered by: Trudee Grip RN    No noted reactions from the previous injection.

## 2019-05-04 ENCOUNTER — Institutional Professional Consult (permissible substitution): Admit: 2019-05-04 | Discharge: 2019-05-04 | Payer: PRIVATE HEALTH INSURANCE

## 2019-05-04 DIAGNOSIS — J3089 Other allergic rhinitis: Secondary | ICD-10-CM

## 2019-05-04 NOTE — Unmapped (Signed)
Extract #1: Escalating: TR, GR, W  Dose: 0.50 ml  Site: left arm  Administered by: Trudee Grip RN    No noted reactions from the previous injection.

## 2019-05-05 NOTE — Unmapped (Signed)
[facility]  ALLERGY TREATMENT VIAL WORKSHEET ENVIRONMENTAL  NAME: Lisa Durham, Lisa Durham DOB: 1996-03-05 DR. SEIDEN DATE: 05/05/2019  EXTRACT#1 START DATE: 11/2018  MOLDS End Point: [name] Dilution: Volume: ml   Fusarium Oxysporium 3 Not treated ---   EXTRACT#2  TREES      Birch 3 Not treated ---   Ash 6 2 0.20   Box Elder 6 2 0.20   GRASSES      Johnson Grass 6 3 0.20   Timothy Grass 6 3 0.20   [place] Grass 6 3 0.20   WEEDS      English Plantain 6 2 0.20   Ragweed 6 2 0.20   Lambs quarter 6 2 0.20   Pigweed 6 2 0.20   Sorrel 6 Not treated ---   Wormwood 6 2 0.20   Total Volume of Antigens: 2.48ml   Total Volume of Diluent: 2.40ml   Total Volume of Glycerin: 1.35ml   Total Volume: 5.44ml   Prepared by: G.Andreea Arca RN 05/05/2019     Charges generated on paper invoice.    Extract lot numbers [place], [place]: [id number] EXP: 06/17/2021  Box Elder/Maple: [id number] EXP: 07/29/2021    English Plantain: [id number] EXP: 12/03/2020  Ragweed Short/Giant: [id number] EXP: 12/17/2020  Lambs Quarter: [id number] EXP: 07/29/2021  Pigweed: [id number] EXP: 07/08/2020   Wormwood: [id number] EXP: 02/15/2021    Corlis Leak: [id number] EXP: 05/06/2021  Juliann Mule: [id number] EXP: 07/26/2020  [place] Grass: [id number] EXP: 06/23/2021    Glycerin: [id number] EXP: 11/2022

## 2019-05-10 ENCOUNTER — Institutional Professional Consult (permissible substitution): Admit: 2019-05-10 | Discharge: 2019-05-10 | Payer: PRIVATE HEALTH INSURANCE

## 2019-05-10 DIAGNOSIS — J3089 Other allergic rhinitis: Secondary | ICD-10-CM

## 2019-05-10 NOTE — Unmapped (Signed)
Extract #1: Escalating: TR, GR, W  Dose: vial test  Site: right arm  Administered by: Trudee Grip RN    No noted reactions from the previous injection.    Vial test: extract #1: 13 mm+ wheal

## 2019-05-13 ENCOUNTER — Institutional Professional Consult (permissible substitution): Admit: 2019-05-13 | Discharge: 2019-05-13 | Payer: PRIVATE HEALTH INSURANCE

## 2019-05-13 ENCOUNTER — Ambulatory Visit: Payer: PRIVATE HEALTH INSURANCE

## 2019-05-13 DIAGNOSIS — J3089 Other allergic rhinitis: Secondary | ICD-10-CM

## 2019-05-13 NOTE — Unmapped (Signed)
Extract #1: Escalating: TR, GR, W  Dose: 0.03 ml  Site: right arm  Administered by: Trudee Grip RN    No noted reactions from the previous vial test.

## 2019-05-17 ENCOUNTER — Institutional Professional Consult (permissible substitution): Admit: 2019-05-17 | Discharge: 2019-05-17 | Payer: PRIVATE HEALTH INSURANCE

## 2019-05-17 DIAGNOSIS — J3089 Other allergic rhinitis: Secondary | ICD-10-CM

## 2019-05-17 NOTE — Unmapped (Signed)
Extract #1: Escalating: TR, GR, W  Dose: 0.05 ml  Site: left arm  Administered by: Trudee Grip RN    No noted reactions from the previous vial test.

## 2019-05-24 ENCOUNTER — Ambulatory Visit: Payer: PRIVATE HEALTH INSURANCE

## 2019-05-31 ENCOUNTER — Institutional Professional Consult (permissible substitution): Admit: 2019-05-31 | Discharge: 2019-05-31 | Payer: PRIVATE HEALTH INSURANCE

## 2019-05-31 DIAGNOSIS — J3089 Other allergic rhinitis: Secondary | ICD-10-CM

## 2019-05-31 NOTE — Unmapped (Signed)
Extract #1: Escalating: TR, GR, W  Dose: 0.05 ml  Site: right arm  Administered by: Trudee Grip RN    No noted reactions from the previous vial test. Missed last injection appointment. Will repeat dose and will advance accordingly for the next appointment.

## 2019-06-07 ENCOUNTER — Institutional Professional Consult (permissible substitution): Admit: 2019-06-07 | Discharge: 2019-06-07 | Payer: PRIVATE HEALTH INSURANCE

## 2019-06-07 DIAGNOSIS — J3089 Other allergic rhinitis: Secondary | ICD-10-CM

## 2019-06-07 NOTE — Unmapped (Signed)
Extract #1: Escalating: TR, GR, W  Dose: 0.10 ml  Site: left arm  Administered by: Trudee Grip RN    No noted reactions from the previous vial test. Missed last injection appointment. Will repeat dose and will advance accordingly for the next appointment.

## 2019-06-14 ENCOUNTER — Institutional Professional Consult (permissible substitution): Admit: 2019-06-14 | Discharge: 2019-06-14 | Payer: PRIVATE HEALTH INSURANCE

## 2019-06-14 ENCOUNTER — Ambulatory Visit: Payer: PRIVATE HEALTH INSURANCE

## 2019-06-14 DIAGNOSIS — J3089 Other allergic rhinitis: Secondary | ICD-10-CM

## 2019-06-14 NOTE — Unmapped (Signed)
Extract #1: Escalating: TR, GR, W  Dose: 0.15 ml  Site: right arm  Administered by: Trudee Grip RN    No noted reactions from the previous vial test. Missed last injection appointment. Will repeat dose and will advance accordingly for the next appointment.

## 2019-06-21 ENCOUNTER — Institutional Professional Consult (permissible substitution): Admit: 2019-06-21 | Discharge: 2019-06-21 | Payer: PRIVATE HEALTH INSURANCE

## 2019-06-21 DIAGNOSIS — J3089 Other allergic rhinitis: Secondary | ICD-10-CM

## 2019-06-21 NOTE — Unmapped (Signed)
Extract #1: Escalating: TR, GR, W  Dose: 0.20 ml  Site: left arm  Administered by: Trudee Grip RN    No noted reactions from the previous vial test. Missed last injection appointment. Will repeat dose and will advance accordingly for the next appointment.

## 2019-06-28 ENCOUNTER — Institutional Professional Consult (permissible substitution): Admit: 2019-06-28 | Discharge: 2019-06-28 | Payer: PRIVATE HEALTH INSURANCE

## 2019-06-28 DIAGNOSIS — J3089 Other allergic rhinitis: Secondary | ICD-10-CM

## 2019-06-28 NOTE — Unmapped (Signed)
Extract #1: Escalating: TR, GR, W  Dose: 0.25 ml  Site: right arm  Administered by: Trudee Grip RN    No noted reactions from the previous vial test. Missed last injection appointment. Will repeat dose and will advance accordingly for the next appointment.

## 2019-06-30 NOTE — Telephone Encounter (Signed)
Called patient to remind her about her cosmetic surgery on 07-15-19.  Told her we needed payment 2 weeks before surgery.  Patient hadn't received quote so one was emailed to her.

## 2019-07-05 ENCOUNTER — Institutional Professional Consult (permissible substitution)
Admit: 2019-07-05 | Discharge: 2019-07-05 | Payer: PRIVATE HEALTH INSURANCE | Attending: Clinical & Laboratory Immunology

## 2019-07-05 DIAGNOSIS — J3089 Other allergic rhinitis: Secondary | ICD-10-CM

## 2019-07-05 NOTE — Progress Notes (Signed)
Patient had a quarter size local reaction with their allergy injections. I reminded them that the pollens are now showing in the air quality, so they need to start keeping track of the pollens counts, keep up their nasal rinsing and stay inside when the pollen counts are high. Pt. Was shone the allergy vials and they verified the date of birth and name was theirs.Patient advised to wait in the waiting room for 20 minutes before leaving office.

## 2019-07-12 ENCOUNTER — Ambulatory Visit: Payer: PRIVATE HEALTH INSURANCE

## 2019-07-12 ENCOUNTER — Institutional Professional Consult (permissible substitution): Admit: 2019-07-12 | Discharge: 2019-07-12 | Payer: PRIVATE HEALTH INSURANCE

## 2019-07-12 ENCOUNTER — Other Ambulatory Visit: Admit: 2019-07-12 | Payer: PRIVATE HEALTH INSURANCE

## 2019-07-12 ENCOUNTER — Institutional Professional Consult (permissible substitution): Admit: 2019-07-12 | Payer: PRIVATE HEALTH INSURANCE

## 2019-07-12 DIAGNOSIS — J3089 Other allergic rhinitis: Secondary | ICD-10-CM

## 2019-07-12 DIAGNOSIS — J3489 Other specified disorders of nose and nasal sinuses: Secondary | ICD-10-CM

## 2019-07-12 DIAGNOSIS — Z01818 Encounter for other preprocedural examination: Secondary | ICD-10-CM

## 2019-07-12 LAB — 2019 NOVEL CORONAVIRUS (COVID-19), NAA-B: SARS-CoV-2: NOT DETECTED

## 2019-07-12 NOTE — Telephone Encounter (Signed)
Called patient gave new surgery time on Thursday, 8/13 @ [facility] 0730 needs to arrive by 0530.

## 2019-07-12 NOTE — Patient Instructions (Signed)
Pre-Procedure Instructions  Were pleased that you have chosen [facility] for your upcoming procedure.  The staff serving you is professionally trained to provide the highest quality care.  We encourage you to ask questions and to let the staff know your special needs.  We want your visit to be as comfortable as possible.    Your surgery is scheduled on July 15, 2019.  Please arrive at 645 am and check in at the Registration Desk on your right as you enter the lobby of the main hospital.    STARTING ONE WEEK BEFORE SURGERY  WE WOULD LIKE YOU TO STOP ASPIRIN, NSAIDS (non-steroidal anti-inflammatories such as Ibuprofen, Advil and Naproxen), SUPPLEMENTS, FISH OIL, VITAMINS, AND HERBAL SUPPLEMENTS.  ACETAMINOPHEN (TYLENOL) IS THE ONLY OVER THE COUNTER PAIN MEDICATION THAT IS OK TO TAKE BEFORE SURGERY.      INSTRUCTIONS FOR THE DAY OF SURGERY   DO NOT EAT OR DRINK ANYTHING (including gum, mints, water, etc.) after midnight the night before your procedure.  You may brush your teeth and gargle on the morning of surgery, but do not swallow any water, except for a small sip, with the following medication: vibyrd, bcp      Please make transportation arrangements and bring a responsible adult to accompany you home and remain with you for 24 hours.     We recommend that you leave valuables (i.e. money, jewelry, credit cards) at home or with your family.  If you wear glasses or contacts, bring a case for safekeeping.     Wear casual, loose fitting, and comfortable clothing.  A gown will be provided. (In an effort to keep your personal belongings safe it is recommended that you leave these belongings with your family or in the car until admitted to your room after surgery. If you do bring a bag for an overnight stay, please note that storage space is limited in the surgery area.)     Bring a list of your medications and dose including herbal.  Do not bring any pills or medications to the hospital. (Exception:  transplant patients.)     Bring a photo ID and your insurance card so your insurance company can be billed directly.     If you have a cold or are sick prior to surgery, contact your surgeon.     Please shower at home the evening before and the morning of surgery using an antibacterial soap, such as Dial or Safeguard.     Please remove all makeup, nail polish, jewelry, body piercings, powder, lotions, and perfume/cologne before you arrive.            Antibacterial showering and good hand hygiene are essential to prevent surgical site infections and reduce the spread of MRSA.  Patient verbalized understanding of these instructions.    Make sure all of your health care givers are checking your ID bracelet and verifying your name and date of birth.  You will actively be involved in verifying the type of surgery you are having and the correct site.  Your health care givers should be cleaning their hands with soap and water or antibacterial foam before taking care of you and if they do not it is ok to remind them to do so.      In an effort to reduce the risks of blood clots after your surgery you may have compression sleeves on your lower legs.  These sleeves help facilitate circulation and decrease the chances of developing any blood  clots.     You may be given an incentive spirometer after surgery to use every hour to help prevent pneumonia by having you take deep breaths in and out.  You will be given instructions about proper use after surgery.         Patient/Family provided education about surgical site infection prevention.    Contact information:    [facility],  Monday - Friday 8:00 am - 4:30 pm,   [phone].    If you need to reach someone outside of regular business hours regarding your surgery please call your surgeon or   [facility] at [phone].

## 2019-07-12 NOTE — Progress Notes (Signed)
Extract #1: Escalating: TR, GR, W  Dose: 0.30 ml  Site: right arm  Administered by: Trudee Grip RN    No noted reactions from the previous vial test. Missed last injection appointment. Will repeat dose and will advance accordingly for the next appointment.

## 2019-07-12 NOTE — Unmapped (Signed)
Covid-19 nasopharyngeal specimen collected.

## 2019-07-15 LAB — POC HCG QUALITATIVE, URINE: hCG Qualitative -Clinitek: NEGATIVE

## 2019-07-15 MED ORDER — gabapentin (NEURONTIN) capsule 300 mg
300 | ORAL | Status: AC | PRN
Start: 2019-07-15 — End: 2019-07-15
  Administered 2019-07-15: 12:00:00 300 mg via ORAL

## 2019-07-15 MED ORDER — rocuronium (ZEMURON) injection
10 | INTRAVENOUS | Status: AC | PRN
Start: 2019-07-15 — End: 2019-07-15
  Administered 2019-07-15: 12:00:00 25 via INTRAVENOUS

## 2019-07-15 MED ORDER — oxyCODONE (ROXICODONE) immediate release tablet 5 mg
5 | ORAL | Status: AC | PRN
Start: 2019-07-15 — End: 2019-07-15

## 2019-07-15 MED ORDER — succinylcholine (QUELICIN) 20 mg/mL injection
20 | INTRAMUSCULAR | Status: AC
Start: 2019-07-15 — End: ?

## 2019-07-15 MED ORDER — fentaNYL (SUBLIMAZE) injection 25 mcg
50 | INTRAMUSCULAR | Status: AC | PRN
Start: 2019-07-15 — End: 2019-07-15

## 2019-07-15 MED ORDER — glucose chewable tablet 12 g
4 | ORAL | Status: AC | PRN
Start: 2019-07-15 — End: 2019-07-15

## 2019-07-15 MED ORDER — dextrose 50 % in water (D50W) iv Syrg 50 mL
INTRAVENOUS | Status: AC | PRN
Start: 2019-07-15 — End: 2019-07-15

## 2019-07-15 MED ORDER — lactated Ringers infusion
INTRAVENOUS | Status: AC
Start: 2019-07-15 — End: 2019-07-15

## 2019-07-15 MED ORDER — glycopyrrolate (ROBINUL) injection
0.2 | INTRAMUSCULAR | Status: AC | PRN
Start: 2019-07-15 — End: 2019-07-15
  Administered 2019-07-15: 15:00:00 .2 via INTRAVENOUS

## 2019-07-15 MED ORDER — succinylcholine (QUELICIN) injection
20 | INTRAMUSCULAR | Status: AC | PRN
Start: 2019-07-15 — End: 2019-07-15
  Administered 2019-07-15: 12:00:00 140 via INTRAVENOUS

## 2019-07-15 MED ORDER — cocaine 4 % external solution
4 | TOPICAL | Status: AC | PRN
Start: 2019-07-15 — End: 2019-07-15
  Administered 2019-07-15: 12:00:00 4 via TOPICAL

## 2019-07-15 MED ORDER — dexamethasone (DECADRON) injection 4 mg
4 | Freq: Once | INTRAMUSCULAR | Status: AC | PRN
Start: 2019-07-15 — End: 2019-07-15

## 2019-07-15 MED ORDER — oxymetazoline (AFRIN) 0.05 % nasal spray
0.05 | NASAL | Status: AC
Start: 2019-07-15 — End: 2019-07-15

## 2019-07-15 MED ORDER — oxymetazoline (AFRIN) 0.05 % nasal spray
0.05 | NASAL | Status: AC | PRN
Start: 2019-07-15 — End: 2019-07-15
  Administered 2019-07-15: 12:00:00 30 via TOPICAL

## 2019-07-15 MED ORDER — lidocaine (PF) 2% (20 mg/mL) Soln
20 | INTRAMUSCULAR | Status: AC | PRN
Start: 2019-07-15 — End: 2019-07-15
  Administered 2019-07-15: 12:00:00 100 via INTRAVENOUS

## 2019-07-15 MED ORDER — ceFAZolin (ANCEF) IVPB 2 g in D5W (duplex)
2 | INTRAVENOUS | Status: AC | PRN
Start: 2019-07-15 — End: 2019-07-15
  Administered 2019-07-15: 12:00:00 2 g via INTRAVENOUS

## 2019-07-15 MED ORDER — ondansetron (ZOFRAN) injection
4 | INTRAMUSCULAR | Status: AC | PRN
Start: 2019-07-15 — End: 2019-07-15
  Administered 2019-07-15: 15:00:00 4 via INTRAVENOUS

## 2019-07-15 MED ORDER — proMETHazine (PHENERGAN) injection 6.25 mg
25 | Freq: Four times a day (QID) | INTRAMUSCULAR | Status: AC | PRN
Start: 2019-07-15 — End: 2019-07-15

## 2019-07-15 MED ORDER — HYDROmorphone (DILAUDID) injection Syrg 0.5 mg
0.5 | INTRAMUSCULAR | Status: AC | PRN
Start: 2019-07-15 — End: 2019-07-15

## 2019-07-15 MED ORDER — scopolamine (TRANSDERM-SCOP) (1 mg over 3 days) 1 patch
1 | Freq: Once | TRANSDERMAL | Status: AC
Start: 2019-07-15 — End: 2019-07-15
  Administered 2019-07-15: 12:00:00 1 via TRANSDERMAL

## 2019-07-15 MED ORDER — dextrose 50 % in water (D50W) iv Syrg 25 mL
INTRAVENOUS | Status: AC | PRN
Start: 2019-07-15 — End: 2019-07-15

## 2019-07-15 MED ORDER — lidocaine (PF) 2% (20 mg/mL) 20 mg/mL (2 %) Soln
20 | INTRAMUSCULAR | Status: AC
Start: 2019-07-15 — End: ?

## 2019-07-15 MED ORDER — HYDROmorphone (DILAUDID) 0.5 mg/0.5 mL injection Syrg
0.5 | INTRAMUSCULAR | Status: AC
Start: 2019-07-15 — End: ?

## 2019-07-15 MED ORDER — midazolam (PF) (VERSED) 1 mg/mL injection
1 | INTRAMUSCULAR | Status: AC
Start: 2019-07-15 — End: ?

## 2019-07-15 MED ORDER — HYDROcodone-acetaminophen (NORCO) 5-325 mg per tablet
5-325 | ORAL_TABLET | Freq: Four times a day (QID) | ORAL | 0 refills | 15.50000 days | Status: AC | PRN
Start: 2019-07-15 — End: 2019-07-20

## 2019-07-15 MED ORDER — ondansetron (ZOFRAN) 4 mg/2 mL injection
4 | INTRAMUSCULAR | Status: AC
Start: 2019-07-15 — End: ?

## 2019-07-15 MED ORDER — cocaine 4 % external solution
4 | TOPICAL | Status: AC
Start: 2019-07-15 — End: 2019-07-15

## 2019-07-15 MED ORDER — HYDROmorphone (DILAUDID) injection Syrg
1 | INTRAMUSCULAR | Status: AC | PRN
Start: 2019-07-15 — End: 2019-07-15
  Administered 2019-07-15: 12:00:00 .5 via INTRAVENOUS

## 2019-07-15 MED ORDER — propofol 10 mg/ml (DIPRIVAN) injection
10 | INTRAVENOUS | Status: AC | PRN
Start: 2019-07-15 — End: 2019-07-15
  Administered 2019-07-15: 12:00:00 200 via INTRAVENOUS

## 2019-07-15 MED ORDER — lidocaine-EPINEPHrine 1 %-1:100,000 injection
1 | INTRAMUSCULAR | Status: AC
Start: 2019-07-15 — End: 2019-07-15

## 2019-07-15 MED ORDER — propofol 10 mg/ml (DIPRIVAN) 10 mg/mL injection
10 | INTRAVENOUS | Status: AC
Start: 2019-07-15 — End: ?

## 2019-07-15 MED ORDER — neostigmine methylsulfate (PROSTIGMIN) IV solution
1 | INTRAVENOUS | Status: AC | PRN
Start: 2019-07-15 — End: 2019-07-15
  Administered 2019-07-15: 15:00:00 2 via INTRAVENOUS

## 2019-07-15 MED ORDER — fentaNYL (SUBLIMAZE) injection 12.5 mcg
50 | INTRAMUSCULAR | Status: AC | PRN
Start: 2019-07-15 — End: 2019-07-15

## 2019-07-15 MED ORDER — HYDROmorphone (DILAUDID) injection Syrg 0.2 mg
0.5 | INTRAMUSCULAR | Status: AC | PRN
Start: 2019-07-15 — End: 2019-07-15

## 2019-07-15 MED ORDER — bacitracin zinc-polymyxin B (POLYSPORIN) 500-10,000 unit/gram topical ointment
500-10000 | TOPICAL | Status: AC
Start: 2019-07-15 — End: 2019-07-15

## 2019-07-15 MED ORDER — bacitracin-polymyxin b (POLYSPORIN) ointment (bulk)
500-10000 | TOPICAL | Status: AC | PRN
Start: 2019-07-15 — End: 2019-07-15
  Administered 2019-07-15: 12:00:00 1 via TOPICAL

## 2019-07-15 MED ORDER — lidocaine-EPINEPHrine 1 %-1:100,000 injection
1 | INTRAMUSCULAR | Status: AC | PRN
Start: 2019-07-15 — End: 2019-07-15
  Administered 2019-07-15: 12:00:00 18 via SUBCUTANEOUS

## 2019-07-15 MED ORDER — naloxone (NARCAN) injection 0.04 mg
0.4 | INTRAMUSCULAR | Status: AC | PRN
Start: 2019-07-15 — End: 2019-07-15

## 2019-07-15 MED ORDER — rocuronium (ZEMURON) 10 mg/mL injection
10 | INTRAVENOUS | Status: AC
Start: 2019-07-15 — End: ?

## 2019-07-15 MED ORDER — ondansetron (ZOFRAN) injection 4 mg
4 | Freq: Three times a day (TID) | INTRAMUSCULAR | Status: AC | PRN
Start: 2019-07-15 — End: 2019-07-15

## 2019-07-15 MED ORDER — midazolam (PF) (VERSED) injection
1 | INTRAMUSCULAR | Status: AC | PRN
Start: 2019-07-15 — End: 2019-07-15
  Administered 2019-07-15: 12:00:00 2 via INTRAVENOUS

## 2019-07-15 MED ORDER — acetaminophen (TYLENOL) tablet 975 mg
325 | ORAL | Status: AC | PRN
Start: 2019-07-15 — End: 2019-07-15
  Administered 2019-07-15: 12:00:00 975 mg via ORAL

## 2019-07-15 MED ORDER — dexamethasone (DECADRON) injection
4 | INTRAMUSCULAR | Status: AC | PRN
Start: 2019-07-15 — End: 2019-07-15
  Administered 2019-07-15: 12:00:00 8 via INTRAVENOUS

## 2019-07-15 MED ORDER — lactated Ringers infusion
INTRAVENOUS | Status: AC | PRN
Start: 2019-07-15 — End: 2019-07-15
  Administered 2019-07-15 (×2): via INTRAVENOUS

## 2019-07-15 MED ORDER — neostigmine methylsulfate (PROSTIGMIN) 1 mg/mL IV solution
1 | INTRAVENOUS | Status: AC
Start: 2019-07-15 — End: ?

## 2019-07-15 MED ORDER — sodium chloride, irrigation 0.9 % irrigation
0.9 | Status: AC | PRN
Start: 2019-07-15 — End: 2019-07-15
  Administered 2019-07-15: 12:00:00 1000

## 2019-07-15 MED ORDER — dexamethasone (DECADRON) 4 mg/mL injection
4 | INTRAMUSCULAR | Status: AC
Start: 2019-07-15 — End: ?

## 2019-07-15 MED ORDER — glycopyrrolate (ROBINUL) 0.2 mg/mL injection
0.2 | INTRAMUSCULAR | Status: AC
Start: 2019-07-15 — End: ?

## 2019-07-15 MED ORDER — oxyCODONE (ROXICODONE) immediate release tablet 2.5 mg
5 | ORAL | Status: AC | PRN
Start: 2019-07-15 — End: 2019-07-15
  Administered 2019-07-15: 17:00:00 2.5 mg via ORAL

## 2019-07-15 MED FILL — LIDOCAINE (PF) 20 MG/ML (2 %) INJECTION SOLUTION: 20 20 mg/mL (2 %) | INTRAMUSCULAR | Qty: 5

## 2019-07-15 MED FILL — ANECTINE 20 MG/ML INJECTION SOLUTION: 20 20 mg/mL | INTRAMUSCULAR | Qty: 10

## 2019-07-15 MED FILL — PROPOFOL 10 MG/ML INTRAVENOUS EMULSION: 10 10 mg/mL | INTRAVENOUS | Qty: 20

## 2019-07-15 MED FILL — GLYCOPYRROLATE 0.2 MG/ML INJECTION SOLUTION: 0.2 0.2 mg/mL | INTRAMUSCULAR | Qty: 2

## 2019-07-15 MED FILL — GABAPENTIN 300 MG CAPSULE: 300 300 MG | ORAL | Qty: 1

## 2019-07-15 MED FILL — LACTATED RINGERS INTRAVENOUS SOLUTION: 20.00 20.00 mL/hr | INTRAVENOUS | Qty: 1000

## 2019-07-15 MED FILL — COCAINE 4 % TOPICAL SOLUTION: 4 4 % | TOPICAL | Qty: 4

## 2019-07-15 MED FILL — ROCURONIUM 10 MG/ML INTRAVENOUS SOLUTION: 10 10 mg/mL | INTRAVENOUS | Qty: 5

## 2019-07-15 MED FILL — TYLENOL 325 MG TABLET: 325 325 mg | ORAL | Qty: 3

## 2019-07-15 MED FILL — DEXAMETHASONE SODIUM PHOSPHATE 4 MG/ML INJECTION SOLUTION: 4 4 mg/mL | INTRAMUSCULAR | Qty: 5

## 2019-07-15 MED FILL — OXYCODONE 5 MG TABLET: 5 5 MG | ORAL | Qty: 1

## 2019-07-15 MED FILL — DOUBLE ANTIBIOTIC (BACITRCN ZN) 500 UNIT-10,000 UNIT/GRAM TOP OINTMENT: 500-10000 500-10,000 unit/gram | TOPICAL | Qty: 28.4

## 2019-07-15 MED FILL — NASAL DECONGESTANT (OXYMETAZOLINE) 0.05 % SPRAY: 0.05 0.05 % | NASAL | Qty: 30

## 2019-07-15 MED FILL — ONDANSETRON HCL (PF) 4 MG/2 ML INJECTION SOLUTION: 4 4 mg/2 mL | INTRAMUSCULAR | Qty: 2

## 2019-07-15 MED FILL — CEFAZOLIN 2 GRAM/50 ML IN DEXTROSE (ISO-OSMOTIC) INTRAVENOUS PIGGYBACK: 2 2 gram/50 mL | INTRAVENOUS | Qty: 50

## 2019-07-15 MED FILL — NEOSTIGMINE METHYLSULFATE 1 MG/ML INTRAVENOUS SOLUTION: 1 1 mg/mL | INTRAVENOUS | Qty: 10

## 2019-07-15 MED FILL — HYDROMORPHONE 0.5 MG/0.5 ML INJECTION SYRINGE: 0.5 0.5 mg/0.5 mL | INTRAMUSCULAR | Qty: 0.5

## 2019-07-15 MED FILL — BACITRACIN ZINC 500 UNIT-POLYMYXIN B 10,000 UNIT/GRAM TOPICAL OINTMENT: 500-10000 500-10,000 unit/gram | TOPICAL | Qty: 28.35

## 2019-07-15 MED FILL — MIDAZOLAM (PF) 1 MG/ML INJECTION SOLUTION: 1 1 mg/mL | INTRAMUSCULAR | Qty: 2

## 2019-07-15 MED FILL — XYLOCAINE WITH EPINEPHRINE 1 %-1:100,000 INJECTION SOLUTION: 1 1 %-1:100,000 | INTRAMUSCULAR | Qty: 20

## 2019-07-15 MED FILL — TRANSDERM-SCOP 1 MG OVER 3 DAYS TRANSDERMAL PATCH: 1 1 mg over 3 days | TRANSDERMAL | Qty: 1

## 2019-07-15 NOTE — Nursing Note (Signed)
Dr. Alveria Apley at bedside.  Made aware that consent is needed.

## 2019-07-15 NOTE — Unmapped (Signed)
[place]                        [facility]      PATIENT NAME:      Lisa Durham, Lisa Durham ??              MRN:               [id number]  DATE OF BIRTH:     12/05/1995                    CSN:               [id number]  PHYSICIAN:         Jasper Riling, MD               ADMIT DATE:        07/15/2019  DICTATED BY:       Jasper Riling, MD               SURGERY DATE:      07/15/2019                              OPERATIVE REPORT    SURGEON:  Jasper Riling, MD      PREOPERATIVE DIAGNOSIS:  Nasal obstruction.    POSTOPERATIVE DIAGNOSIS:  Nasal obstruction.    OPERATIONS PERFORMED:    1. Septoplasty.  2. Nasal valve repair.  3. Inferior turbinate reduction and outfracture.  4. Rhinoplasty (non-insurance based).    ASSISTANCE SURGEON:  Dicie Beam.    COMPLICATIONS:  None apparent.    DISPOSITION:  None.    SPECIMENS:  None.    INDICATIONS:  This is a very pleasant 23 year old female referred with nasal obstructive symptomatology and desire for nasal reshaping.  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  Aesthetically, she had   prominent dorsal convexity over projection of the nasal tip.  She was interested in deprojecting her dorsum and tip narrowing the bony and cartilaginous mid vault and slight increased tip rotation and refinement.  We had a detailed discussion of risks,   benefits, and alternatives.  The patient provided her written and verbal consent to proceed.    DESCRIPTION OF PROCEDURE:  The patient was identified in the preoperative holding area and transferred to the operative theater where general endotracheal anesthesia was achieved.  Table was rotated to 90 degrees.  Patient was prepped and draped in   sterile standard fashion.  4% cocaine pledgets were introduced and removed from the nasal cavities and 1% lidocaine with 1:100,000 epinephrine was utilized to anesthetize and decongest the nose further.  Inverted V incision was  connected to marginal   incisions.  The skin and soft tissue envelope was elevated off the nasal tip and mid vault, and at the bony level a subperiosteal plane was established to complete the exposure.  The anterior septal angle was identified and pockets were made in the   submucoperichondrial plane to divide the upper lateral cartilages from the nasal septum.  Next, the dorsal convexity was addressed.  The septum was reduced sharply and then a Rubin osteotome was utilized to take down the bony dorsum.  Further refinement   was then performed using a series of rasps and additional sharp resection including reduction of the height of the upper lateral cartilages.  Given the extent  of the convexity, there was an open roof after the completion of the reduction and therefore   high low high osteotomies were performed bilaterally to close the roof and narrow the mid vault.  Next, the quadrangular cartilage was addressed and 15 mm dorsal and caudal struts were preserved and the quadrangular cartilage removed.  There was a   significant bony deflection of the maxillary crest and the vomer, which were further removed using osteotome and open Jansen-Middleton techniques.  The quadrangular cartilage was then fashioned into thin spreader grafts to open the internal nasal valves   and to reestablish support and continuity of the cartilaginous mid vault.  These were replaced between the septum and the upper lateral cartilages, which had been reduced in height and secured using a series of mattress style PDS sutures.  Next, the   nasal tip was deprojected.  The scrolls were divided as were the connection between the medial crus and the septum in order to deproject the tripod.  The new position was then secured using a columellar strut was secured using mattress style technique.    Interdomal sutures were then applied to replace the domal cartilages in an appropriate configuration and position.  The skin and soft tissue envelope  was redraped and the inferior turbinates were intramurally ablated and outfractured and then the   mucoperichondrial flaps apposed.  The inverted V incision was closed as were the marginal incisions and a Doyle splint was placed at this point.  Cast was applied overlying Steri-Strips.  The procedure was terminated.  The patient's care was returned to   the Anesthesiology Service.  There were no acute complications, and I was present throughout.        Jasper Riling, MD      RC/AQ  DD:?? 07/15/2019 13:29:01  DT:?? 07/15/2019 13:51:05    JOB#:  [id number]

## 2019-07-15 NOTE — Unmapped (Signed)
Anesthesia Transfer of Care Note    Patient: Lisa Durham  Procedure(s) Performed: Procedure(s):  SEPTOPLASTY, TURBINATE REDUCTION, (COSMETIC) RHINOPLASTY    Patient location: PACU    Anesthesia type: general endotracheal    Airway Device on Arrival to PACU/ICU: Venti-mask    IV Access: Peripheral    Monitors Recommended to be Used During PACU/ICU: Standard Monitors    Outstanding Issues to Address: None    Level of Consciousness: awake and alert     Post vital signs:    Vitals:    07/15/19 1110   BP: 116/80   Pulse: 81   Resp: 18   Temp: 98.5 ??F (36.9 ??C)   SpO2: 100%       Complications: None    Date 07/14/19 0700 - 07/15/19 0659(Not Admitted) 07/15/19 0700 - 07/16/19 0659   Shift 0700-1459 1500-2259 2300-0659 24 Hour Total 0700-1459 1500-2259 2300-0659 24 Hour Total   INTAKE   I.V.     1000(14.8)   1000(14.8)     Volume (mL) (lactated Ringers infusion)     1000   1000   Shift Total(mL/kg)     1000(14.8)   1000(14.8)   OUTPUT   Shift Total(mL/kg)           Weight (kg)   67.6 67.6 67.6 67.6 67.6 67.6

## 2019-07-15 NOTE — Unmapped (Signed)
1238 - arrival from pacu; report received from staff; pt awake and alert on arrival; moderate amount serosanguinous dng noted on moustache dressing; denies nausea; mild discomfort verbalized; states she already had some cookies; drinking apple juice; requesting ice water at this time.  1245 - medicated as noted per [facility]; mother to bedside; moustache dressing changed and mother instructed on dressing change; verbalizes understanding; no new dng noted at this time.  1315 - up to BR to void; gait steady; tolerating oral intake well.  1345 - discharge instructions reviewed with pt and mother; verbalize understanding; stable for discharge; gauze and tape moustache dressing with scant serosanguinous dng noted at this time.  1408 -   Pt. Discharged per wheelchair to exit.    Dressing: splint cast dgs CD&I; moustache dressing with scant/small serosanguinous dng noted    Discharge Pain level- 2/10 Patient states pain is tolerable.    Patient and family verbalized understanding of discharge instructions.      Vitals:    07/15/19 1330   BP: 120/73   Pulse: 91   Resp: 14   Temp: 97.9 ??F (36.6 ??C)   SpO2: 100%             I/O this shift:  In: 1410 [P.O.:360; I.V.:1050]  Out: -       Lisa Durham

## 2019-07-15 NOTE — Other (Signed)
INTRA-OP POST BRIEFING NOTE: Wilfred Lacy      Specimens:     Prior to leaving the room: Nurse confirmed name of procedure, completion of instrument, sponge & needle counts, reads specimen labels aloud including patient name and addresses any equipment issues? Nurse confirmed wound class. Nurse to surgeon and anesthesia: What are key concerns for recovery and management of the patient?  Yes      Blood products stored at appropriate temperatures prior to return to blood bank (if applicable)? N/A      Patient identification band secured on patient prior to transfer out of the operating room? Yes    Temporary devices implanted for the duration of the surgery removed and evaluated for intactness and completeness prior to closure? N/A      Other Comments:     Signed: Bracken Moffa A Jaline Pincock    Date: 07/15/2019    Time: 10:42 AM

## 2019-07-15 NOTE — Unmapped (Signed)
Please consider the following instructions during your recovery process from your open septorhinoplasty:    1. OK to remove the mustache dressing in 24 hours; you can reinforce or change it as needed.  It is normal to have some bleeding during the first 48 hours, please call if bleeding does not stop after 10 minutes  2. Once the wound is exposed, it is very important to keep it clean and moist - I recommend using Cetaphil soap and Aquaphor ointment.  3. Avoid strenuous activity your follow-up in the office with me  4. Sleep with your head elevated  5. Use the pain meds that you were prescribed, if needed, and avoid Aspirin and Ibuprofen products that increase the chance of bleeding  6. OK to shower after 24 hours.  7. Use nasal saline sprays 4 times per day, 2-3 sprays each side of the nose through the [facility] splints to prevent crusting  8. Avoid laying on or bumping your nose, avoid blowing your nose  9. You have splints on the inside of your nose that are sewn in place - these will be removed at your postop appointment    Please call [phone] with any questions.       Medication List      TAKE these medications, which are NEW      Quantity/Refills   HYDROcodone-acetaminophen 5-325 mg per tablet  Commonly known as:  Norco  Take 1 tablet by mouth every 6 hours as needed for Pain for up to 7 days.   Quantity:  28 tablet  Refills:  0        TAKE these medications, which you were ALREADY TAKING      Quantity/Refills   erenumab-aooe 70 mg/mL Atin  Commonly known as:  AIMOVIG AUTO-INJECTOR  Inject subcutaneously.   Refills:  0     gabapentin 100 MG capsule  Commonly known as:  NEURONTIN  Take 100 mg by mouth.   Refills:  0     Gianvi (28) 3-0.02 mg per tablet  Generic drug:  drospirenone-ethinyl estradioL   Refills:  0     methocarbamoL 500 MG tablet  Commonly known as:  ROBAXIN  Take 500 mg by mouth.   Refills:  0     naproxen sodium 220 MG tablet  Commonly known as:  ANAPROX  220 mg.   Refills:  0     Ventolin HFA 90  mcg/actuation inhaler  Generic drug:  albuterol   Refills:  0     Viibryd 40 mg Tab  Generic drug:  vilazodone  Take 40 mg by mouth daily.   Refills:  0           Where to Get Your Medications      These medications were sent to [facility] [address], [place] - [address]  [address], [place] [address]    Phone:  [phone]   ?? HYDROcodone-acetaminophen 5-325 mg per tablet

## 2019-07-15 NOTE — Unmapped (Signed)
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   There is no problem list on file for this patient.    Patient reports no changes to her medical history or symptoms since her last visit with Dr. Alveria Apley.     Past Surgical History:   Procedure Laterality Date   ??? MYRINGOTOMY W/ TUBES     ??? WISDOM TOOTH EXTRACTION       Family History   Problem Relation Age of Onset   ??? Squamous cell carcinoma Father    ??? Melanoma Neg Hx      Social History     Socioeconomic History   ??? Marital status: Single     Spouse name: Not on file   ??? Number of children: Not on file   ??? Years of education: Not on file   ??? Highest education level: Not on file   Occupational History   ??? Not on file   Social Needs   ??? Financial resource strain: Not on file   ??? Food insecurity     Worry: Not on file     Inability: Not on file   ??? Transportation needs     Medical: Not on file     Non-medical: Not on file   Tobacco Use   ??? Smoking status: Never Smoker   ??? Smokeless tobacco: Never Used   Substance and Sexual Activity   ??? Alcohol use: No   ??? Drug use: Never   ??? Sexual activity: Not on file   Lifestyle   ??? Physical activity     Days per week: Not on file     Minutes per session: Not on file   ??? Stress: Not on file   Relationships   ??? Social Wellsite geologist on phone: Not on file     Gets together: Not on file     Attends religious service: Not on file     Active member of club or organization: Not on file     Attends meetings of clubs or organizations: Not on file     Relationship status: Not on file   ???  Intimate partner violence     Fear of current or ex partner: Not on file     Emotionally abused: Not on file     Physically abused: Not on file     Forced sexual activity: Not on file   Other Topics Concern   ??? Caffeine Use Not Asked   ??? Occupational Exposure Not Asked   ??? Exercise Not Asked   ??? Seat Belt Not Asked   Social History Narrative   ??? Not on file     DRUG/FOOD ALLERGIES: Prochlorperazine; Metoclopramide hcl; Metoclopramide; and Prochlorperazine edisylate  CURRENT MEDICATIONS  Prior to Admission medications    Medication Sig Start Date End Date Taking? Authorizing Provider   gabapentin (NEURONTIN) 100 MG capsule Take 100 mg by mouth.   Yes Historical Provider, MD   methocarbamoL (ROBAXIN) 500 MG tablet Take 500  mg by mouth.   Yes Historical Provider, MD   vilazodone (VIIBRYD) 40 mg Tab Take 40 mg by mouth daily.   Yes Historical Provider, MD   erenumab-aooe (AIMOVIG AUTO-INJECTOR) 70 mg/mL AtIn Inject subcutaneously. 03/25/18   Historical Provider, MD   Helen Hashimoto, 28, 3-0.02 mg per tablet  02/10/16   Historical Provider, MD   naproxen sodium (ANAPROX) 220 MG tablet 220 mg.    Historical Provider, MD   VENTOLIN HFA 90 mcg/actuation inhaler  11/29/15   Historical Provider, MD      REVIEW OF SYSTEMS  See scanned Review of Systems sheet. Constitutional Eyes Cardiovascular Respiratory Gastrointestinal Genitourinary Musculoskeletal Integumentary Neurological Psychiatric Endocrine Hematologic/Lymphatic Allergic/Immunologic non contributary except as noted.    PHYSICAL EXAM  BP 111/75    Pulse 72    Temp 98.5 ??F (36.9 ??C) (Oral)    Resp 18    Ht 5' 4 (1.626 m)    Wt 149 lb (67.6 kg)    LMP 07/14/2019 (Exact Date)    SpO2 98%    BMI 25.58 kg/m??   GENERAL: No Acute Distress, Alert and Oriented  See office note for additional  CHEST: Normal respiratory effort, no retractions  CV: Regular rate.     ASSESSMENT  Lisa Durham is a 23 y.o. female withNasal obstruction [J34.89]  Deviated septum [J34.2]  Nasal valve collapse  [J34.89] plan to undergo Procedure(s):  SEPTOPLASTY, TURBINATE REDUCTION, (COSMETIC) RHINOPLASTY    PLAN  - Risk Benefits and alternative discussed regarding the planned procedure.   - Informed consent obtained within 60 days of procedure.   - Antibiotics on call order per protocol.   - The patient agrees to proceed with Procedure(s):  SEPTOPLASTY, TURBINATE REDUCTION, (COSMETIC) RHINOPLASTY.

## 2019-07-15 NOTE — Unmapped (Signed)
Anesthesia Extubation Criteria:    Airway Device: endotracheal tube    Emergence Details:      Smooth      _x_      Stormy       __       Prolonged   __     Extubation Criteria:      Motor strength intact       _x_      Follows commands        _x_      Good airway reflexes      _x_      OP suctioned                  _x_        Follows commands:  Yes     Patient extubated:  Yes

## 2019-07-15 NOTE — Unmapped (Deleted)
Anesthesia Transfer of Care Note    Patient: Lisa Durham  Procedure(s) Performed: Procedure(s):  SEPTOPLASTY, TURBINATE REDUCTION, (COSMETIC) RHINOPLASTY    Patient location: PACU    Anesthesia type: general endotracheal    Airway Device on Arrival to PACU/ICU: Nasal Cannula    IV Access: Peripheral    Monitors Recommended to be Used During PACU/ICU: Standard Monitors    Outstanding Issues to Address: Hematologic    Level of Consciousness: awake    Post vital signs:    Vitals:    07/15/19 1105   BP: 120/74   Pulse: 81   Resp: 18   Temp:    SpO2: 100%       Complications: None    Date 07/14/19 0700 - 07/15/19 0659(Not Admitted) 07/15/19 0700 - 07/16/19 0659   Shift 0700-1459 1500-2259 2300-0659 24 Hour Total 0700-1459 1500-2259 2300-0659 24 Hour Total   INTAKE   I.V.     1000(14.8)   1000(14.8)     Volume (mL) (lactated Ringers infusion)     1000   1000   Shift Total(mL/kg)     1000(14.8)   1000(14.8)   OUTPUT   Shift Total(mL/kg)           Weight (kg)   67.6 67.6 67.6 67.6 67.6 67.6

## 2019-07-15 NOTE — Unmapped (Signed)
[facility]  DEPARTMENT OF ANESTHESIOLOGY  PRE-PROCEDURAL EVALUATION    Lisa Durham is a 23 y.o. year old female presenting for:    Procedure(s):  SEPTOPLASTY, TURBINATE REDUCTION, (COSMETIC) RHINOPLASTY    Surgeon:   Jasper Riling, MD    Chief Complaint     Nasal obstruction [J34.89]; Deviated septum [J34.2]; Nasal valve collapse *    Review of Systems     Anesthesia Evaluation    Patient summary reviewed and nursing notes reviewed.       I have reviewed the History and Physical Exam, any relevant changes are noted in the anesthesia pre-operative evaluation.      Cardiovascular:    Exercise tolerance: good  Duke Met score: 12 - Running continuously at 8 mph (8-9 minute mile.)  (-) hypertension, past MI, CAD, cardiomyopathy, no hyperlipidemia.    Neuro/Muscoloskeletal/Psych:    (+) headaches.    (-) seizures, TIA, CVA.     Pulmonary:    (+) asthma.    (-) recent URI, sleep apnea.       GI/Hepatic/Renal:      (-) liver disease, renal disease.    Endo/Other:        (-) diabetes mellitus, hypothyroidism, hyperthyroidism, no anemia, no thrombocytopenia.       Past Medical History     Past Medical History:   Diagnosis Date   ??? Asthma    ??? Attention deficit disorder    ??? Concussion    ??? Depression    ??? Migraine        Past Surgical History     Past Surgical History:   Procedure Laterality Date   ??? MYRINGOTOMY W/ TUBES     ??? WISDOM TOOTH EXTRACTION         Family History     Family History   Problem Relation Age of Onset   ??? Squamous cell carcinoma Father    ??? Melanoma Neg Hx        Social History     Social History     Socioeconomic History   ??? Marital status: Single     Spouse name: Not on file   ??? Number of children: Not on file   ??? Years of education: Not on file   ??? Highest education level: Not on file   Occupational History   ??? Not on file   Social Needs   ??? Financial resource strain: Not on file   ??? Food insecurity     Worry: Not on file     Inability: Not on file   ??? Transportation needs     Medical: Not on file      Non-medical: Not on file   Tobacco Use   ??? Smoking status: Never Smoker   ??? Smokeless tobacco: Never Used   Substance and Sexual Activity   ??? Alcohol use: No   ??? Drug use: Never   ??? Sexual activity: Not on file   Lifestyle   ??? Physical activity     Days per week: Not on file     Minutes per session: Not on file   ??? Stress: Not on file   Relationships   ??? Social Wellsite geologist on phone: Not on file     Gets together: Not on file     Attends religious service: Not on file     Active member of club or organization: Not on file     Attends meetings of clubs or  organizations: Not on file     Relationship status: Not on file   ??? Intimate partner violence     Fear of current or ex partner: Not on file     Emotionally abused: Not on file     Physically abused: Not on file     Forced sexual activity: Not on file   Other Topics Concern   ??? Caffeine Use Not Asked   ??? Occupational Exposure Not Asked   ??? Exercise Not Asked   ??? Seat Belt Not Asked   Social History Narrative   ??? Not on file       Medications     Allergies:  Allergies   Allergen Reactions   ??? Prochlorperazine Anxiety   ??? Metoclopramide Hcl      anxiety   ??? Metoclopramide Anxiety   ??? Prochlorperazine Edisylate Anxiety       Home Meds:  Prior to Admission medications as of 07/15/19 0630   Medication Sig Taking?   gabapentin (NEURONTIN) 100 MG capsule Take 100 mg by mouth. Yes   methocarbamoL (ROBAXIN) 500 MG tablet Take 500 mg by mouth. Yes   vilazodone (VIIBRYD) 40 mg Tab Take 40 mg by mouth daily. Yes   erenumab-aooe (AIMOVIG AUTO-INJECTOR) 70 mg/mL AtIn Inject subcutaneously.    GIANVI, 28, 3-0.02 mg per tablet     naproxen sodium (ANAPROX) 220 MG tablet 220 mg.    VENTOLIN HFA 90 mcg/actuation inhaler         Inpatient Meds:  Scheduled:   ??? bacitracin zinc-polymyxin B       ??? cocaine       ??? lidocaine-EPINEPHrine       ??? oxymetazoline       ??? scopolamine  1 patch Transdermal Once     Continuous:   ??? lactated Ringers         PRN: acetaminophen, ceFAZolin  (ANCEF) IVPB, dextrose 50 % in water (D50W) **OR** dextrose 50 % in water (D50W), gabapentin, glucose    Vital Signs     Wt Readings from Last 3 Encounters:   07/15/19 149 lb (67.6 kg)   02/08/19 155 lb (70.3 kg)   01/11/19 159 lb (72.1 kg)     Ht Readings from Last 3 Encounters:   07/15/19 5' 4 (1.626 m)   02/08/19 5' 4 (1.626 m)   01/11/19 5' 4 (1.626 m)     Temp Readings from Last 3 Encounters:   07/15/19 98.5 ??F (36.9 ??C) (Oral)     BP Readings from Last 3 Encounters:   07/15/19 111/75   11/16/18 126/80   10/26/18 112/62     Pulse Readings from Last 3 Encounters:   07/15/19 72   11/16/18 68   10/26/18 62     @LASTSAO2 (3)@    Physical Exam     Airway:     Mallampati: I  Mouth Opening: >2 FB  TM distance: > = 3 FB  Neck ROM: full    Dental:        Pulmonary:   - normal exam     Cardiovascular:     Rhythm: regular  Rate: normal    Neuro/Musculoskeletal/Psych:  - normal neurological exam.   Mental status: alert and oriented to person, place and time.          Abdominal:    - normal exam    Current OB Status:       Other Findings:        Laboratory Data  No results found for: WBC, HGB, HCT, MCV, PLT    No results found for: ABORH    No results found for: GLUCOSE, BUN, CO2, CREATININE, K, NA, CL, CALCIUM, ALBUMIN, PROT, ALKPHOS, ALT, AST, BILITOT    No results found for: PTT, INR    No results found for: PREGTESTUR, PREGSERUM, HCG, HCGQUANT    Anesthesia Plan     ASA 1         Female and current non-smoker    Anesthesia Type:  general endotracheal.      PONV Risk Factors: female, current non-smoker,                    Anesthetic plan and risks discussed with patient.    Plan, alternatives, and risks of anesthesia, including death, have been explained to and discussed with the patient/legal guardian.  By my assessment, the patient/legal guardian understands and agrees.  Scenario presented in detail.  Questions answered.    Use of blood products discussed with patient who. Blood products not discussed.      Plan  discussed with CRNA.

## 2019-07-15 NOTE — Anesthesia Post-Procedure Evaluation (Signed)
Anesthesia Post Note    Patient: Lisa Durham    Procedure(s) Performed: Procedure(s):  SEPTOPLASTY, TURBINATE REDUCTION, (COSMETIC) RHINOPLASTY    Anesthesia type: general endotracheal    Patient location: PACU    Post pain: Adequate analgesia    Post assessment: no apparent anesthetic complications    Last Vitals:   Vitals:    07/15/19 1110 07/15/19 1115 07/15/19 1130 07/15/19 1145   BP: 109/66 114/71 102/61 119/83   BP Location: Left arm Left arm Left arm Left arm   Patient Position: Lying Lying Lying Lying   Pulse: 81 84 88 80   Resp: 26 19 20 14    Temp:       TempSrc:       SpO2: 99% 98% 99% 99%   Weight:       Height:            Post vital signs: stable    Level of consciousness: awake, alert  and oriented    Complications: None

## 2019-07-15 NOTE — OR Nursing (Addendum)
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

## 2019-07-15 NOTE — Brief Op Note (Signed)
SEPTOPLASTY, TURBINATE REDUCTION, (COSMETIC) RHINOPLASTY  Procedure Note    SAHASRA BELUE  07/15/2019      Pre-op Diagnosis: Nasal obstruction [J34.89]  Deviated septum [J34.2]  Nasal valve collapse [J34.89]       Post-op Diagnosis: Nasal obstruction [J34.89]  Deviated septum [J34.2]  Nasal valve collapse [J34.89]    Procedure(s):  SEPTOPLASTY, TURBINATE REDUCTION, (COSMETIC) RHINOPLASTY      Surgeon(s):  Jasper Riling, MD    Anesthesia: General    Staff:   Circulator: Lyndel Pleasure, RN  Scrub Person: Adelfa Koh, RN  RN First Assistant: Hessie Knows, RN  Resident: Raoul Pitch, MD    Estimated Blood Loss: Minimal                 Specimens: none           Drains:  none      There were no complications unless listed below.        Diamon Reddinger MICHAEL Ardelia Wrede     Date: 07/15/2019  Time: 11:01 AM

## 2019-07-16 NOTE — Telephone Encounter (Signed)
Spoke with Lawson Fiscal patients mother she had question about how to clean the area after the mustache Band-Aid removal. She was advised half strength peroxide and gently dab at the area. All question have been answered.

## 2019-07-16 NOTE — Telephone Encounter (Signed)
Patient had surgery yesterday and her mother has some questions about cleaning and aftercare at surgery site.

## 2019-07-19 ENCOUNTER — Ambulatory Visit: Payer: PRIVATE HEALTH INSURANCE

## 2019-07-19 NOTE — Telephone Encounter (Signed)
Pt's mother is calling to be sure her morning call gets handled for more pain medication. Also, mom states her uvula appears thin,clear, hanging low, and dripping.(just creepy)  Please advise.

## 2019-07-19 NOTE — Telephone Encounter (Signed)
Patients mother called and said that her daughter Lisa Durham needs a refill for her pain medication.

## 2019-07-20 MED ORDER — HYDROcodone-acetaminophen (NORCO) 5-325 mg per tablet
5-325 | ORAL_TABLET | Freq: Four times a day (QID) | ORAL | 0 refills | 15.50000 days | Status: AC | PRN
Start: 2019-07-20 — End: 2019-07-27

## 2019-07-20 NOTE — Unmapped (Signed)
Per Dr  Alveria Apley    Please refill Norco. The uvula is likely 2/2 the ETT and should resolve in a matter of a couple weeks. Thank you!

## 2019-07-23 ENCOUNTER — Ambulatory Visit: Admit: 2019-07-23 | Discharge: 2019-07-23 | Payer: PRIVATE HEALTH INSURANCE

## 2019-07-23 ENCOUNTER — Institutional Professional Consult (permissible substitution): Admit: 2019-07-23 | Discharge: 2019-07-23 | Payer: PRIVATE HEALTH INSURANCE

## 2019-07-23 DIAGNOSIS — J3089 Other allergic rhinitis: Secondary | ICD-10-CM

## 2019-07-23 DIAGNOSIS — J3489 Other specified disorders of nose and nasal sinuses: Secondary | ICD-10-CM

## 2019-07-23 NOTE — Unmapped (Signed)
This patient is 23yoF s/p functional/aesthetic nasal surgery. The procedure was smooth without immediate complications. There have been issues with bleeding, pain, infection, or fluid collection since surgery. The patient states compliance with instructed wound care.    All internal and external splints carefully removed  Any non-absorbable sutures removed  Septum intact and near midline  Turbinates reduced and lateralized  Nasal valve appear patent and structurally sound  No signs of infection, bleeding or wound dehiscence  All flaps viable  Nasal airway appears widely patent and breathing subjectively improved.    Assessment:    Healing well without complication  Immediate functional improvement present      Plan:    Wound care & activity instructions provided      RTC:

## 2019-07-23 NOTE — Unmapped (Signed)
Extract #1: Escalating: TR, GR, W  Dose: 0.35 ml  Site: left arm  Administered by: Trudee Grip RN    No noted reactions from the previous vial test. Missed last injection appointment. Will repeat dose and will advance accordingly for the next appointment.

## 2019-07-26 ENCOUNTER — Ambulatory Visit: Payer: PRIVATE HEALTH INSURANCE

## 2019-07-27 ENCOUNTER — Institutional Professional Consult (permissible substitution): Admit: 2019-07-27 | Discharge: 2019-07-27 | Payer: PRIVATE HEALTH INSURANCE

## 2019-07-27 DIAGNOSIS — J3089 Other allergic rhinitis: Secondary | ICD-10-CM

## 2019-07-27 NOTE — Unmapped (Signed)
Extract #1: Escalating: TR, GR, W  Dose: 0.40 ml  Site: right arm  Administered by: Trudee Grip RN    No noted reactions from the previous vial test. Missed last injection appointment. Will repeat dose and will advance accordingly for the next appointment.

## 2019-08-02 ENCOUNTER — Institutional Professional Consult (permissible substitution)
Admit: 2019-08-02 | Discharge: 2019-08-02 | Payer: PRIVATE HEALTH INSURANCE | Attending: Clinical & Laboratory Immunology

## 2019-08-02 DIAGNOSIS — J3089 Other allergic rhinitis: Secondary | ICD-10-CM

## 2019-08-02 NOTE — Progress Notes (Signed)
Patient hasn't had any reactions with their allergy injections, and is doing well with their immunotherapy. I reminded them that the pollens are now showing in the air quality, so they need to start keeping track of the pollens counts, keep up their nasal rinsing and stay inside when the pollen counts are high. Pt. Was shone the allergy vials and they verified the date of birth and name was theirs.Patient advised to wait in the waiting room for 20 minutes before leaving office.

## 2019-08-11 ENCOUNTER — Institutional Professional Consult (permissible substitution): Admit: 2019-08-11 | Discharge: 2019-08-11 | Payer: PRIVATE HEALTH INSURANCE

## 2019-08-11 DIAGNOSIS — J3089 Other allergic rhinitis: Secondary | ICD-10-CM

## 2019-08-11 NOTE — Progress Notes (Signed)
Extract #1: Escalating: TR, GR, W  Dose: 0.50 ml  Site: right arm  Administered by: Trudee Grip RN    No noted reactions from the previous vial test. Missed last injection appointment. Will repeat dose and will advance accordingly for the next appointment.

## 2019-08-18 ENCOUNTER — Ambulatory Visit: Payer: PRIVATE HEALTH INSURANCE

## 2019-08-25 ENCOUNTER — Institutional Professional Consult (permissible substitution)
Admit: 2019-08-25 | Discharge: 2019-08-25 | Payer: PRIVATE HEALTH INSURANCE | Attending: Clinical & Laboratory Immunology

## 2019-08-25 DIAGNOSIS — J3089 Other allergic rhinitis: Secondary | ICD-10-CM

## 2019-08-25 NOTE — Unmapped (Signed)
Patient hasn't had any reactions with their allergy injections, and is doing well with their immunotherapy. I reminded them that the pollens are now showing in the air quality, so they need to start keeping track of the pollens counts, keep up their nasal rinsing and stay inside when the pollen counts are high. Pt. Was shone the allergy vials and they verified the date of birth and name was theirs.Patient advised to wait in the waiting room for 20 minutes before leaving office.

## 2019-08-25 NOTE — Unmapped (Signed)
[facility]  ALLERGY TREATMENT VIAL WORKSHEET ENVIRONMENTAL  NAME: Lisa Durham, Lisa Durham DOB: May 21, 1996 DR. SEIDEN DATE: 08/25/2019  EXTRACT#1 START DATE: 11/2018  MOLDS End Point: [name] Dilution: Volume: ml   Fusarium Oxysporium 3 Not treated ---   EXTRACT#2  TREES      Birch 3 Not treated ---   Ash 6 1 0.20   Box Elder 6 1 0.20   GRASSES      Johnson Grass 6 2 0.20   Timothy Grass 6 2 0.20   [place] Grass 6 2 0.20   WEEDS      English Plantain 6 1 0.20   Ragweed 6 1 0.20   Lambs quarter 6 1 0.20   Pigweed 6 1 0.20   Sorrel 6 Not treated ---   Wormwood 6 1 0.20   Total Volume of Antigens: 2.103ml   Total Volume of Diluent: 2.62ml   Total Volume of Glycerin: 1.18ml   Total Volume: 5.21ml   Prepared by: G.Kelse Ploch RN 08/25/2019     Charges generated on paper invoice.    Extract lot numbers [place], [place]: [id number] EXP: 06/17/2021  Box Elder/Maple: [id number] EXP: 07/29/2021    English Plantain: [id number] EXP: 12/03/2020  Ragweed Short/Giant: [id number] EXP: 12/17/2020  Lambs Quarter: [id number] EXP: 07/29/2021  Pigweed: [id number] EXP: 07/08/2020   Wormwood: [id number] EXP: 02/15/2021    Corlis Leak: [id number] EXP: 05/06/2021  Juliann Mule: [id number] EXP: 07/26/2020  [place] Grass: [id number] EXP: 06/23/2021    Glycerin: [id number] EXP: 11/2022

## 2019-09-01 ENCOUNTER — Institutional Professional Consult (permissible substitution)
Admit: 2019-09-01 | Discharge: 2019-09-01 | Payer: PRIVATE HEALTH INSURANCE | Attending: Clinical & Laboratory Immunology

## 2019-09-01 DIAGNOSIS — J3089 Other allergic rhinitis: Secondary | ICD-10-CM

## 2019-09-01 NOTE — Unmapped (Signed)
Patient hasn't had any reactions with their allergy injections, and is doing well with their immunotherapy. I reminded them that the pollens are now showing in the air quality, so they need to start keeping track of the pollens counts, keep up their nasal rinsing and stay inside when the pollen counts are high. Pt. Was shone the allergy vials and they verified the date of birth and name was theirs.Patient advised to wait in the waiting room for 20 minutes before leaving office.

## 2019-09-06 ENCOUNTER — Ambulatory Visit: Payer: PRIVATE HEALTH INSURANCE

## 2019-09-09 ENCOUNTER — Institutional Professional Consult (permissible substitution): Admit: 2019-09-09 | Discharge: 2019-09-09 | Payer: PRIVATE HEALTH INSURANCE

## 2019-09-09 DIAGNOSIS — J3089 Other allergic rhinitis: Secondary | ICD-10-CM

## 2019-09-09 NOTE — Unmapped (Signed)
Extract #1: Escalating: TR, GR, W  Dose: 0.05 ml   ESC.  Site: LUA  Administered by: Betti Cruz LPN    No noted reactions from the previous vial test. Missed last injection appointment. Will repeat dose and will advance accordingly for the next appointment.

## 2019-09-16 ENCOUNTER — Ambulatory Visit: Payer: PRIVATE HEALTH INSURANCE

## 2019-09-17 ENCOUNTER — Ambulatory Visit: Payer: PRIVATE HEALTH INSURANCE

## 2019-09-23 ENCOUNTER — Institutional Professional Consult (permissible substitution): Admit: 2019-09-23 | Discharge: 2019-09-23 | Payer: PRIVATE HEALTH INSURANCE

## 2019-09-23 DIAGNOSIS — J3089 Other allergic rhinitis: Secondary | ICD-10-CM

## 2019-09-23 NOTE — Unmapped (Signed)
Extract #1: Escalating: TR, GR, W  Dose: 0.05 ml  Site: right arm  Administered by: Trudee Grip RN    No noted reactions from the previous vial test. Missed last injection appointment. Will repeat dose and will advance accordingly for the next appointment.    Missed last injection appointment. Will repeat dose and will advance accordingly for the next appointment.

## 2019-09-27 ENCOUNTER — Ambulatory Visit: Admit: 2019-09-27 | Discharge: 2019-09-27 | Payer: PRIVATE HEALTH INSURANCE

## 2019-09-27 DIAGNOSIS — J3489 Other specified disorders of nose and nasal sinuses: Secondary | ICD-10-CM

## 2019-09-27 NOTE — Unmapped (Signed)
This patient is 23yoF s/p functional/aesthetic nasal surgery now 8 weeks out. The procedure was smooth without immediate complications. There have been issues with bleeding, pain, infection, or fluid collection since surgery. The patient states compliance with instructed wound care.    All internal and external splints carefully removed  Septum intact and near midline  Turbinates reduced and lateralized  Nasal valve appear patent and structurally sound  No signs of infection, bleeding or wound dehiscence  All flaps viable  Nasal airway appears widely patent and breathing subjectively improved.    Assessment:    Healing well without complication  Immediate functional improvement present      Plan:    Wound care & activity instructions provided      RTC:  PRN

## 2019-09-30 ENCOUNTER — Ambulatory Visit: Payer: PRIVATE HEALTH INSURANCE

## 2019-10-07 ENCOUNTER — Ambulatory Visit: Payer: PRIVATE HEALTH INSURANCE

## 2019-10-14 ENCOUNTER — Institutional Professional Consult (permissible substitution)
Admit: 2019-10-14 | Discharge: 2019-10-14 | Payer: PRIVATE HEALTH INSURANCE | Attending: Clinical & Laboratory Immunology

## 2019-10-14 ENCOUNTER — Ambulatory Visit: Payer: PRIVATE HEALTH INSURANCE

## 2019-10-14 DIAGNOSIS — J3089 Other allergic rhinitis: Secondary | ICD-10-CM

## 2019-10-14 NOTE — Unmapped (Signed)
Patient hasn't had any reactions from their allergy injections. Doing well with their Immunotherapy. Humidification and hydration guidelines were discussed. Patient was advised to continue their nasal rinsing and stay inside when mold counts were high. Patient was shone their allergy vials to verify their name and date of birth.

## 2019-10-21 ENCOUNTER — Ambulatory Visit: Payer: PRIVATE HEALTH INSURANCE

## 2019-10-22 ENCOUNTER — Institutional Professional Consult (permissible substitution): Admit: 2019-10-22 | Payer: PRIVATE HEALTH INSURANCE

## 2019-10-22 DIAGNOSIS — J3089 Other allergic rhinitis: Secondary | ICD-10-CM

## 2019-10-22 NOTE — Unmapped (Signed)
Extract #1: Escalating: TR, GR, W  Dose: 0.15 ml  Site: right arm  Administered by: Trudee Grip RN    No noted reactions from the previous vial test. Missed last injection appointment. Will repeat dose and will advance accordingly for the next appointment.

## 2019-10-26 ENCOUNTER — Ambulatory Visit: Payer: PRIVATE HEALTH INSURANCE

## 2019-10-27 ENCOUNTER — Ambulatory Visit: Payer: PRIVATE HEALTH INSURANCE

## 2019-11-05 ENCOUNTER — Institutional Professional Consult (permissible substitution): Admit: 2019-11-05 | Discharge: 2019-11-05 | Payer: PRIVATE HEALTH INSURANCE

## 2019-11-05 DIAGNOSIS — J3089 Other allergic rhinitis: Secondary | ICD-10-CM

## 2019-11-05 NOTE — Unmapped (Signed)
Extract #1: Escalating: TR, GR, W  Dose: 0.15 ml  Site: left arm  Administered by: Trudee Grip RN    No noted reactions from the previous vial test. Missed last injection appointment. Will repeat dose and will advance accordingly for the next appointment.

## 2019-11-09 ENCOUNTER — Institutional Professional Consult (permissible substitution): Admit: 2019-11-09 | Discharge: 2019-11-09 | Payer: PRIVATE HEALTH INSURANCE

## 2019-11-09 DIAGNOSIS — J3089 Other allergic rhinitis: Secondary | ICD-10-CM

## 2019-11-09 NOTE — Unmapped (Signed)
Extract #1: Escalating: TR, GR, W  Dose: 0.20 ml  Site: right arm  Administered by: Trudee Grip RN    No noted reactions from the previous vial test. Missed last injection appointment. Will repeat dose and will advance accordingly for the next appointment.

## 2019-11-16 ENCOUNTER — Institutional Professional Consult (permissible substitution): Admit: 2019-11-16 | Discharge: 2019-11-16 | Payer: PRIVATE HEALTH INSURANCE

## 2019-11-16 DIAGNOSIS — J3089 Other allergic rhinitis: Secondary | ICD-10-CM

## 2019-11-16 NOTE — Unmapped (Signed)
Extract #1: Escalating: TR, GR, W  Dose: 0.25 ml  Site: left arm  Administered by: Trudee Grip RN    No noted reactions from the previous vial test. Missed last injection appointment. Will repeat dose and will advance accordingly for the next appointment.

## 2019-11-23 ENCOUNTER — Institutional Professional Consult (permissible substitution): Admit: 2019-11-23 | Discharge: 2019-11-23 | Payer: PRIVATE HEALTH INSURANCE

## 2019-11-23 DIAGNOSIS — J3089 Other allergic rhinitis: Secondary | ICD-10-CM

## 2019-11-23 NOTE — Progress Notes (Signed)
Extract #1: Escalating: TR, GR, W  Dose: 0.3 ml  Site: right arm  Administered by: Trudee Grip RN    No noted reactions from the previous vial test. Missed last injection appointment. Will repeat dose and will advance accordingly for the next appointment.

## 2019-11-29 MED ORDER — COVID-19 vacc,mRNA,Moderna,-PF 100 mcg/0.5 mL Susp
100 | INTRAMUSCULAR | 1 refills | 21.00000 days | Status: AC
Start: 2019-11-29 — End: ?

## 2019-11-29 MED FILL — COVID-19 VACCINE,MRNA,CX024414(MODERNA)(PF)100 MCG/0.5 ML IM SUSP: 100 100 mcg/0.5 mL | INTRAMUSCULAR | 28 days supply | Qty: 0.5 | Fill #0

## 2019-11-29 NOTE — Progress Notes (Signed)
Your patient has received the following vaccination at the [facility]:    Patient provided assent to receive the COVID-19 vaccine: Yes      Immunizations Administered on Date of Encounter - 11/29/2019  Never Reviewed    Name Date Dose VIS Date Route Exp Date    COVID-19, mRNA, Moderna 11/29/2019 0.5 mL 11/2019 Intramuscular 05/29/2020    Site: Left deltoid     Given By: Williemae Natter, PharmD     Manufacturer: Moderna     Lot: [id number]     Comment: [id number]     NDC: 80777-273-99           Needle gauge: 25 G 1  EUA fact sheet given to patient: Yes  VAERS reviewed: Yes    Please contact pharmacy at [phone] for any questions.    Thank you,    Vayda Dungee

## 2019-11-30 ENCOUNTER — Institutional Professional Consult (permissible substitution): Admit: 2019-11-30 | Discharge: 2019-11-30 | Payer: PRIVATE HEALTH INSURANCE

## 2019-11-30 DIAGNOSIS — J3089 Other allergic rhinitis: Secondary | ICD-10-CM

## 2019-11-30 NOTE — Progress Notes (Signed)
Extract #1: Escalating: TR, GR, W  Dose: 0.35 ml  Site: left arm  Administered by: Trudee Grip RN    No noted reactions from the previous vial test. Missed last injection appointment. Will repeat dose and will advance accordingly for the next appointment.

## 2019-12-08 ENCOUNTER — Institutional Professional Consult (permissible substitution): Admit: 2019-12-08 | Payer: PRIVATE HEALTH INSURANCE | Attending: Clinical & Laboratory Immunology

## 2019-12-08 DIAGNOSIS — J3089 Other allergic rhinitis: Secondary | ICD-10-CM

## 2019-12-08 NOTE — Progress Notes (Signed)
Patient had a quarter size reaction from their allergy injections.will repeat dose and give in the hip. Humidification and hydration guidelines were discussed. Patient was advised to continue their nasal rinsing and stay inside when mold counts were high. Patient was shone their allergy vials to verify their name and date of birth.

## 2019-12-15 ENCOUNTER — Ambulatory Visit: Payer: PRIVATE HEALTH INSURANCE

## 2019-12-16 ENCOUNTER — Institutional Professional Consult (permissible substitution): Admit: 2019-12-16 | Discharge: 2019-12-16 | Payer: PRIVATE HEALTH INSURANCE

## 2019-12-16 DIAGNOSIS — J3089 Other allergic rhinitis: Secondary | ICD-10-CM

## 2019-12-16 NOTE — Unmapped (Signed)
Extract #1: Escalating: TR, GR, W  Dose: 0.4 ml  Site: right arm  Administered by: Trudee Grip RN    No noted reactions from the previous vial test. Missed last injection appointment. Will repeat dose and will advance accordingly for the next appointment.

## 2019-12-22 ENCOUNTER — Institutional Professional Consult (permissible substitution)
Admit: 2019-12-22 | Discharge: 2019-12-22 | Payer: PRIVATE HEALTH INSURANCE | Attending: Clinical & Laboratory Immunology

## 2019-12-22 ENCOUNTER — Ambulatory Visit: Payer: PRIVATE HEALTH INSURANCE

## 2019-12-22 DIAGNOSIS — J3089 Other allergic rhinitis: Secondary | ICD-10-CM

## 2019-12-22 NOTE — Unmapped (Signed)
Patient hasn't had any reactions from their allergy injections. Doing well with their Immunotherapy. Humidification and hydration guidelines were discussed. Patient was advised to continue their nasal rinsing and stay inside when mold counts were high. Patient was shone their allergy vials to verify their name and date of birth.

## 2019-12-27 ENCOUNTER — Ambulatory Visit: Admit: 2019-12-27 | Discharge: 2019-12-27 | Payer: PRIVATE HEALTH INSURANCE | Attending: Otolaryngic Allergy

## 2019-12-27 DIAGNOSIS — T7840XD Allergy, unspecified, subsequent encounter: Secondary | ICD-10-CM

## 2019-12-27 NOTE — Unmapped (Signed)
Chief Complaint   Patient presents with   ??? Allergies     year fu   follow-up allergies     History of Present Illness  Has been on shots just over one yr, still weekly, feels they are helping, noting somewhat less congestion which had been her main concern. Not using any sprays or nasal meds, has not had sinusitis, no significant reaction to shots.      Cancer Staging  Cancer Staging  No matching staging information was found for the patient.    Histories  She has a past medical history of Asthma, Attention deficit disorder, Concussion, Depression, and Migraine.    She has a past surgical history that includes Wisdom tooth extraction; Myringotomy w/ tubes; and septoplasty/rhinoplasty (N/A, 07/15/2019).    Her family history includes Squamous cell carcinoma in her father.    She reports that she has never smoked. She has never used smokeless tobacco. She reports that she does not drink alcohol or use drugs.    Allergies  Metoclopramide, Prochlorperazine, Metoclopramide hcl, and Prochlorperazine edisylate    Medications  Outpatient Encounter Medications as of 12/27/2019   Medication Sig Dispense Refill   ??? COVID-19 vacc,mRNA,Moderna,-PF 100 mcg/0.5 mL Susp Inject 0.5 mLs into the muscle every 28 days. 0.5 mL 1   ??? erenumab-aooe (AIMOVIG AUTO-INJECTOR) 70 mg/mL AtIn Inject subcutaneously.     ??? gabapentin (NEURONTIN) 100 MG capsule Take 100 mg by mouth.     ??? GIANVI, 28, 3-0.02 mg per tablet      ??? methocarbamoL (ROBAXIN) 500 MG tablet Take 500 mg by mouth.     ??? naproxen sodium (ANAPROX) 220 MG tablet 220 mg.     ??? VENTOLIN HFA 90 mcg/actuation inhaler      ??? vilazodone (VIIBRYD) 40 mg Tab Take 40 mg by mouth daily.       No facility-administered encounter medications on file as of 12/27/2019.         The following portions of the patient's history were reviewed and updated as appropriate: allergies, current medications, past family history, past medical history, past social history, past surgical history and problem  list.    Review of Systems:  [x] * See scanned Review of Systems sheet.    Vitals  There were no vitals taken for this visit.  Physical Exam  HENT:      Ears:      Comments: Able to communicate well on phone.  Pulmonary:      Effort: Pulmonary effort is normal.      Breath sounds: Normal breath sounds.   Neurological:      Mental Status: She is alert and oriented to person, place, and time.   Psychiatric:         Attention and Perception: Attention and perception normal.         Mood and Affect: Mood and affect normal.         Speech: Speech normal.         Behavior: Behavior is cooperative.         Thought Content: Thought content normal.         Judgment: Judgment normal.            Assessment  Allergies, doing well on immunotherapy    Plan  Continue immunotherapy, no Rxs needed.      Medical Decision Making:  The following items were considered in medical decision making:    LOS MDM    - See body of note  for supportive documentation related to level of service

## 2019-12-28 MED FILL — COVID-19 VACCINE,MRNA,CX024414(MODERNA)(PF)100 MCG/0.5 ML IM SUSP: 100 100 mcg/0.5 mL | INTRAMUSCULAR | 28 days supply | Qty: 0.5 | Fill #1

## 2019-12-29 ENCOUNTER — Ambulatory Visit: Payer: PRIVATE HEALTH INSURANCE

## 2020-01-05 ENCOUNTER — Ambulatory Visit: Payer: PRIVATE HEALTH INSURANCE

## 2020-01-06 ENCOUNTER — Ambulatory Visit: Payer: PRIVATE HEALTH INSURANCE

## 2020-01-13 ENCOUNTER — Institutional Professional Consult (permissible substitution): Admit: 2020-01-13 | Discharge: 2020-01-13 | Payer: PRIVATE HEALTH INSURANCE

## 2020-01-13 DIAGNOSIS — J3089 Other allergic rhinitis: Secondary | ICD-10-CM

## 2020-01-13 NOTE — Unmapped (Signed)
Extract #1: Escalating: TR, GR, W  Dose: 0.45 ml  Site: right arm  Administered by: Trudee Grip RN    No noted reactions from the previous vial test. Missed last injection appointment. Will repeat dose and will advance accordingly for the next appointment.    Missed last injection appt. Repeat dose and will escalate for the next appt.

## 2020-01-17 ENCOUNTER — Ambulatory Visit: Payer: PRIVATE HEALTH INSURANCE

## 2020-01-19 ENCOUNTER — Ambulatory Visit: Payer: PRIVATE HEALTH INSURANCE

## 2020-01-19 ENCOUNTER — Institutional Professional Consult (permissible substitution)
Admit: 2020-01-19 | Discharge: 2020-01-19 | Payer: PRIVATE HEALTH INSURANCE | Attending: Clinical & Laboratory Immunology

## 2020-01-19 DIAGNOSIS — J3089 Other allergic rhinitis: Secondary | ICD-10-CM

## 2020-01-19 NOTE — Unmapped (Signed)
[facility]  ALLERGY TREATMENT VIAL WORKSHEET ENVIRONMENTAL  NAME: JOAN, HOGAN DOB: March 01, 1996 DR. SEIDEN DATE: 01/19/2020  EXTRACT#1 START DATE: 11/2018  MOLDS End Point: [name] Dilution: Volume: ml   Fusarium Oxysporium 3 Not treated ---   EXTRACT#2  TREES      Birch 3 Not treated ---   Ash 6 Concentrate 0.20   Box Elder 6 Concentrate 0.20   GRASSES      Johnson Grass 6 1 0.20   Timothy Grass 6 1 0.20   [place] Grass 6 1 0.20   WEEDS      English Plantain 6 Concentrate 0.20   Ragweed 6 Concentrate 0.20   Lambs quarter 6 Concentrate 0.20   Pigweed 6 Concentrate 0.20   Sorrel 6 Not treated ---   Wormwood 6 Concentrate 0.20   Total Volume of Antigens: 2.66ml   Total Volume of Diluent: 3.14ml   Total Volume: 5.3ml   Prepared by: G.Shondell Fabel RN 01/19/2020     Charges generated on paper invoice.    Extract lot numbers [place], [place]: [id number] EXP: 07/13/2022  Box Elder/Maple: [id number] EXP: 10/02/2022    English Plantain: [id number] EXP: 10/02/2022  Ragweed Short/Giant: [id number] EXP: 02/13/2022  Lambs Quarter: [id number] EXP: 05/31/2022  Pigweed: [id number] EXP: 09/16/2021  Wormwood: [id number] EXP: 10/05/2021    Corlis Leak: [id number] EXP: 08/30/2022  Juliann Mule: [id number] EXP: 11/22/2021  [place] Grass: (10) [id number] EXP: 08/04/2022

## 2020-01-19 NOTE — Unmapped (Signed)
Patient hasn't had any reactions from their allergy injections. Doing well with their Immunotherapy. Humidification and hydration guidelines were discussed. Patient was advised to continue their nasal rinsing and stay inside when mold counts were high. Patient was shone their allergy vials to verify their name and date of birth.

## 2020-01-26 ENCOUNTER — Institutional Professional Consult (permissible substitution)
Admit: 2020-01-26 | Discharge: 2020-01-26 | Payer: PRIVATE HEALTH INSURANCE | Attending: Clinical & Laboratory Immunology

## 2020-01-26 DIAGNOSIS — J3089 Other allergic rhinitis: Secondary | ICD-10-CM

## 2020-01-26 NOTE — Unmapped (Signed)
Patient hasn't had any reactions from their allergy injections. Doing well with their Immunotherapy. Humidification and hydration guidelines were discussed. Patient was advised to continue their nasal rinsing and stay inside when mold counts were high. Patient was shone their allergy vials to verify their name and date of birth.

## 2020-01-31 ENCOUNTER — Institutional Professional Consult (permissible substitution)
Admit: 2020-01-31 | Discharge: 2020-01-31 | Payer: PRIVATE HEALTH INSURANCE | Attending: Clinical & Laboratory Immunology

## 2020-01-31 DIAGNOSIS — J3089 Other allergic rhinitis: Secondary | ICD-10-CM

## 2020-01-31 NOTE — Unmapped (Signed)
Patient hasn't had any reactions from their allergy injections. Doing well with their Immunotherapy. Humidification and hydration guidelines were discussed. Patient was advised to continue their nasal rinsing and stay inside when mold counts were high. Patient was shone their allergy vials to verify their name and date of birth.

## 2020-02-07 ENCOUNTER — Ambulatory Visit: Payer: PRIVATE HEALTH INSURANCE

## 2020-02-08 ENCOUNTER — Institutional Professional Consult (permissible substitution): Admit: 2020-02-08 | Discharge: 2020-02-08 | Payer: PRIVATE HEALTH INSURANCE

## 2020-02-08 DIAGNOSIS — J3089 Other allergic rhinitis: Secondary | ICD-10-CM

## 2020-02-08 NOTE — Unmapped (Signed)
Extract #1: Escalating: TR, GR, W  Dose: 0.1 ml  Site: right arm  Administered by: Trudee Grip RN    No noted reactions from the previous vial test. Missed last injection appointment. Will repeat dose and will advance accordingly for the next appointment.

## 2020-02-14 ENCOUNTER — Institutional Professional Consult (permissible substitution)
Admit: 2020-02-14 | Discharge: 2020-02-14 | Payer: PRIVATE HEALTH INSURANCE | Attending: Clinical & Laboratory Immunology

## 2020-02-14 DIAGNOSIS — J3089 Other allergic rhinitis: Secondary | ICD-10-CM

## 2020-02-14 NOTE — Unmapped (Signed)
Extract #1: Escalating: TR, GR, W  Dose: 0.15 ml  Site: left arm  Administered by: G. Keelin Sheridan RN    No noted reactions from the previous vial test. Missed last injection appointment. Will repeat dose and will advance accordingly for the next appointment.

## 2020-02-18 ENCOUNTER — Ambulatory Visit: Admit: 2020-02-18 | Discharge: 2020-02-18 | Payer: PRIVATE HEALTH INSURANCE

## 2020-02-18 DIAGNOSIS — J3489 Other specified disorders of nose and nasal sinuses: Secondary | ICD-10-CM

## 2020-02-18 NOTE — Unmapped (Signed)
This patient is 23yoF s/p functional/aesthetic nasal surgery now 6 months out. The procedure was smooth without immediate complications. There have been issues with bleeding, pain, infection, or fluid collection since surgery. The patient states compliance with instructed wound care.    Septum intact and near midline  Turbinates reduced and lateralized  Nasal valve appear patent and structurally sound  No signs of infection, bleeding or wound dehiscence  All flaps viable  Nasal airway appears widely patent and breathing subjectively improved.    Assessment:    Healing well without complication  Immediate functional improvement present      Plan:    Wound care & activity instructions provided      RTC:  PRN

## 2020-02-21 ENCOUNTER — Institutional Professional Consult (permissible substitution)
Admit: 2020-02-21 | Discharge: 2020-02-21 | Payer: PRIVATE HEALTH INSURANCE | Attending: Clinical & Laboratory Immunology

## 2020-02-21 DIAGNOSIS — J3089 Other allergic rhinitis: Secondary | ICD-10-CM

## 2020-02-21 NOTE — Unmapped (Signed)
Patient hasn't had any reactions with their allergy injections, and is doing well with their immunotherapy. I reminded them that the pollens are now showing in the air quality, so they need to start keeping track of the pollens counts, keep up their nasal rinsing and stay inside when the pollen counts are high. Pt. Was shone the allergy vials and they verified the date of birth and name was theirs.Patient advised to wait in the waiting room for 20 minutes before leaving office.

## 2020-02-28 ENCOUNTER — Ambulatory Visit: Payer: PRIVATE HEALTH INSURANCE

## 2020-03-01 ENCOUNTER — Institutional Professional Consult (permissible substitution)
Admit: 2020-03-01 | Discharge: 2020-03-01 | Payer: PRIVATE HEALTH INSURANCE | Attending: Clinical & Laboratory Immunology

## 2020-03-01 DIAGNOSIS — J3089 Other allergic rhinitis: Secondary | ICD-10-CM

## 2020-03-01 NOTE — Unmapped (Signed)
Patient hasn't had any reactions with their allergy injections, and is doing well with their immunotherapy. I reminded them that the pollens are now showing in the air quality, so they need to start keeping track of the pollens counts, keep up their nasal rinsing and stay inside when the pollen counts are high. Pt. Was shone the allergy vials and they verified the date of birth and name was theirs.Patient advised to wait in the waiting room for 20 minutes before leaving office.

## 2020-03-06 ENCOUNTER — Ambulatory Visit: Payer: PRIVATE HEALTH INSURANCE

## 2020-03-08 ENCOUNTER — Institutional Professional Consult (permissible substitution): Admit: 2020-03-08 | Payer: PRIVATE HEALTH INSURANCE | Attending: Clinical & Laboratory Immunology

## 2020-03-08 DIAGNOSIS — J3089 Other allergic rhinitis: Secondary | ICD-10-CM

## 2020-03-08 NOTE — Unmapped (Signed)
Patient hasn't had any reactions with their allergy injections, and is doing well with their immunotherapy. I reminded them that the pollens are now showing in the air quality, so they need to start keeping track of the pollens counts, keep up their nasal rinsing and stay inside when the pollen counts are high. Pt. Was shone the allergy vials and they verified the date of birth and name was theirs.Patient advised to wait in the waiting room for 20 minutes before leaving office.

## 2020-03-13 ENCOUNTER — Ambulatory Visit: Payer: PRIVATE HEALTH INSURANCE

## 2020-03-15 ENCOUNTER — Institutional Professional Consult (permissible substitution)
Admit: 2020-03-15 | Discharge: 2020-03-15 | Payer: PRIVATE HEALTH INSURANCE | Attending: Clinical & Laboratory Immunology

## 2020-03-15 DIAGNOSIS — J3089 Other allergic rhinitis: Secondary | ICD-10-CM

## 2020-03-15 NOTE — Unmapped (Signed)
Patient hasn't had any reactions with their allergy injections, and is doing well with their immunotherapy. I reminded them that the pollens are now showing in the air quality, so they need to start keeping track of the pollens counts, keep up their nasal rinsing and stay inside when the pollen counts are high. Pt. Was shone the allergy vials and they verified the date of birth and name was theirs.Patient advised to wait in the waiting room for 20 minutes before leaving office.

## 2020-03-20 ENCOUNTER — Ambulatory Visit: Payer: PRIVATE HEALTH INSURANCE

## 2020-03-22 ENCOUNTER — Ambulatory Visit: Payer: PRIVATE HEALTH INSURANCE

## 2020-03-24 ENCOUNTER — Institutional Professional Consult (permissible substitution): Admit: 2020-03-24 | Discharge: 2020-03-24 | Payer: PRIVATE HEALTH INSURANCE

## 2020-03-24 DIAGNOSIS — J3089 Other allergic rhinitis: Secondary | ICD-10-CM

## 2020-03-24 NOTE — Unmapped (Signed)
Extract #1: Escalating: TR, GR, W  Dose: 0.4 ml  Site: right arm  Administered by: G. Eldean Klatt RN    No noted reactions from the previous vial test. Missed last injection appointment. Will repeat dose and will advance accordingly for the next appointment.

## 2020-03-27 ENCOUNTER — Ambulatory Visit: Payer: PRIVATE HEALTH INSURANCE

## 2020-03-29 ENCOUNTER — Institutional Professional Consult (permissible substitution): Admit: 2020-03-29 | Discharge: 2020-03-29 | Payer: PRIVATE HEALTH INSURANCE

## 2020-03-29 DIAGNOSIS — J3089 Other allergic rhinitis: Secondary | ICD-10-CM

## 2020-03-29 NOTE — Unmapped (Signed)
Extract #1: Escalating: TR, GR, W  Dose: 0.45 ml  Site: left arm  Administered by: Trudee Grip RN    No noted reactions from the previous vial test. Missed last injection appointment. Will repeat dose and will advance accordingly for the next appointment.

## 2020-04-05 ENCOUNTER — Ambulatory Visit: Payer: PRIVATE HEALTH INSURANCE

## 2020-04-07 ENCOUNTER — Institutional Professional Consult (permissible substitution): Admit: 2020-04-07 | Discharge: 2020-04-07 | Payer: PRIVATE HEALTH INSURANCE

## 2020-04-07 DIAGNOSIS — J3089 Other allergic rhinitis: Secondary | ICD-10-CM

## 2020-04-07 NOTE — Unmapped (Signed)
Extract #1: Escalating: TR, GR, W  Dose: 0.4 ml  Site: right arm  Administered by: Trudee Grip RN    Noted a large local reaction from the last injection. Dose decreased to 0.4 ml today and will escalate as tolerated for the next appointment.

## 2020-04-11 ENCOUNTER — Ambulatory Visit: Payer: PRIVATE HEALTH INSURANCE

## 2020-04-12 ENCOUNTER — Ambulatory Visit: Payer: PRIVATE HEALTH INSURANCE

## 2020-04-18 ENCOUNTER — Institutional Professional Consult (permissible substitution): Admit: 2020-04-18 | Discharge: 2020-04-18 | Payer: PRIVATE HEALTH INSURANCE

## 2020-04-18 DIAGNOSIS — J3089 Other allergic rhinitis: Secondary | ICD-10-CM

## 2020-04-18 NOTE — Unmapped (Signed)
Extract #1: Escalating: TR, GR, W  Dose: 0.4 ml  Site: left arm  Administered by: Trudee Grip RN    Noted a large local reaction from the last injection. Dose decreased to 0.4 ml today and will escalate as tolerated for the next appointment.    Missed last injection appt. Dose repeated and will escalate for the next appointment.

## 2020-04-25 ENCOUNTER — Institutional Professional Consult (permissible substitution)
Admit: 2020-04-25 | Discharge: 2020-04-25 | Payer: PRIVATE HEALTH INSURANCE | Attending: Clinical & Laboratory Immunology

## 2020-04-25 DIAGNOSIS — J3089 Other allergic rhinitis: Secondary | ICD-10-CM

## 2020-04-25 NOTE — Unmapped (Signed)
Patient hasn't had any reactions with their allergy injections, and is doing well with their immunotherapy. I reminded them that the pollens are now showing in the air quality, so they need to start keeping track of the pollens counts, keep up their nasal rinsing and stay inside when the pollen counts are high. Pt. Was shone the allergy vials and they verified the date of birth and name was theirs.Patient advised to wait in the waiting room for 20 minutes before leaving office.

## 2020-05-15 ENCOUNTER — Institutional Professional Consult (permissible substitution): Admit: 2020-05-15 | Payer: PRIVATE HEALTH INSURANCE | Attending: Clinical & Laboratory Immunology

## 2020-05-15 DIAGNOSIS — J3089 Other allergic rhinitis: Secondary | ICD-10-CM

## 2020-05-15 NOTE — Unmapped (Signed)
Patient hasn't had any reactions with their allergy injections, and is doing well with their immunotherapy. I reminded them that the pollens are now showing in the air quality, so they need to start keeping track of the pollens counts, keep up their nasal rinsing and stay inside when the pollen counts are high. Pt. Was shone the allergy vials and they verified the date of birth and name was theirs.Patient advised to wait in the waiting room for 20 minutes before leaving office.

## 2020-05-22 NOTE — Unmapped (Signed)
[facility]  ALLERGY TREATMENT VIAL WORKSHEET ENVIRONMENTAL  NAME: Lisa Durham, Lisa Durham DOB: March 21, 1996 DR. SEIDEN DATE: 05/22/2020  EXTRACT#1 START DATE: 11/2018  MOLDS End Point: [name] Dilution: Volume: ml   Fusarium Oxysporium 3 Not treated ---   EXTRACT#2  TREES      Birch 3 Not treated ---   Ash 6 Concentrate 0.20   Box Elder 6 Concentrate 0.20   GRASSES      Johnson Grass 6 Concentrate 0.20   Timothy Grass 6 Concentrate 0.20   [place] Grass 6 Concentrate 0.20   WEEDS      English Plantain 6 Concentrate 0.20   Ragweed 6 Concentrate 0.20   Lambs quarter 6 Concentrate 0.20   Pigweed 6 Concentrate 0.20   Sorrel 6 Not treated ---   Wormwood 6 Concentrate 0.20   Total Volume of Antigens: 2.20ml   Total Volume of Diluent: 3.39ml   Total Volume: 5.59ml   Prepared by: G.Sabriya Yono RN 05/22/2020     Charges generated on paper invoice.    Extract lot numbers [place], [place]: [id number] EXP: 02/19/2023  Box Elder/Maple: [id number] EXP: 12/08/2022    English Plantain: [id number] EXP: 01/10/2023  Ragweed Short/Giant: [id number] EXP: 06/05/2022  Lambs Quarter: [id number] EXP: 12/08/2022  Pigweed: [id number] EXP: 12/08/2022    Corlis Leak: [id number] EXP: 12/08/2022  Juliann Mule: [id number] EXP: 01/02/2022  [place] Grass: (10) [id number] EXP: 07/13/2023

## 2020-05-25 ENCOUNTER — Institutional Professional Consult (permissible substitution): Admit: 2020-05-25 | Discharge: 2020-05-25 | Payer: PRIVATE HEALTH INSURANCE

## 2020-05-25 DIAGNOSIS — J3089 Other allergic rhinitis: Secondary | ICD-10-CM

## 2020-05-25 NOTE — Unmapped (Signed)
Extract #1: Escalating: TR, GR, W  Dose: vial test  Site: left arm  Administered by: Trudee Grip RN    No noted reactions from the previous injection. The patient denies any fever or any other symptoms from the last 24 hours.     Vial test: 11 mm wheal.

## 2020-05-29 ENCOUNTER — Ambulatory Visit: Payer: PRIVATE HEALTH INSURANCE

## 2020-06-01 ENCOUNTER — Institutional Professional Consult (permissible substitution): Admit: 2020-06-01 | Payer: PRIVATE HEALTH INSURANCE

## 2020-06-01 DIAGNOSIS — J3089 Other allergic rhinitis: Secondary | ICD-10-CM

## 2020-06-01 NOTE — Unmapped (Signed)
Extract #1: Escalating: TR, GR, W  Dose: 0.05 ml  Site: right arm  Administered by: Trudee Grip RN    No noted reactions from the previous injection. The patient denies any fever or any other symptoms from the last 24 hours.

## 2020-06-05 ENCOUNTER — Ambulatory Visit: Payer: PRIVATE HEALTH INSURANCE

## 2020-06-08 ENCOUNTER — Institutional Professional Consult (permissible substitution): Admit: 2020-06-08 | Payer: PRIVATE HEALTH INSURANCE

## 2020-06-08 DIAGNOSIS — J3089 Other allergic rhinitis: Secondary | ICD-10-CM

## 2020-06-08 NOTE — Unmapped (Signed)
Extract #1: Escalating: TR, GR, W  Dose: 0.1 ml  Site: left arm  Administered by: Trudee Grip RN    No noted reactions from the previous injection. The patient denies any fever or any other symptoms from the last 24 hours.

## 2020-06-12 ENCOUNTER — Institutional Professional Consult (permissible substitution): Admit: 2020-06-12 | Payer: PRIVATE HEALTH INSURANCE

## 2020-06-12 DIAGNOSIS — J3089 Other allergic rhinitis: Secondary | ICD-10-CM

## 2020-06-12 NOTE — Unmapped (Signed)
Extract #1: Escalating: TR, GR, W  Dose: 0.15 ml  Site: right arm  Administered by: Trudee Grip RN    No noted reactions from the previous injection. The patient denies any fever or any other symptoms from the last 24 hours.

## 2020-06-19 ENCOUNTER — Institutional Professional Consult (permissible substitution)
Admit: 2020-06-19 | Discharge: 2020-06-19 | Payer: PRIVATE HEALTH INSURANCE | Attending: Clinical & Laboratory Immunology

## 2020-06-19 DIAGNOSIS — J3089 Other allergic rhinitis: Secondary | ICD-10-CM

## 2020-06-19 NOTE — Unmapped (Signed)
Patient hasn't had any reactions from their allergy injections. Doing well with their Immunotherapy. Mold and pollens avoidence guidelines were discussed. Patient was advised to continue their nasal rinsing and stay inside when mold and pollen counts were high. Patient was shone their allergy vials to verify their name and date of birth.

## 2020-06-26 ENCOUNTER — Ambulatory Visit: Payer: PRIVATE HEALTH INSURANCE

## 2020-07-03 ENCOUNTER — Institutional Professional Consult (permissible substitution): Admit: 2020-07-03 | Payer: PRIVATE HEALTH INSURANCE

## 2020-07-03 DIAGNOSIS — J3089 Other allergic rhinitis: Secondary | ICD-10-CM

## 2020-07-03 NOTE — Unmapped (Signed)
Extract #1: Escalating: TR, GR, W  Dose: 0.25 ml  Site: right arm  Administered by: Trudee Grip RN    No noted reactions from the previous injection. The patient denies any fever or any other symptoms from the last 24 hours.

## 2020-07-10 ENCOUNTER — Ambulatory Visit: Payer: PRIVATE HEALTH INSURANCE

## 2020-07-11 ENCOUNTER — Institutional Professional Consult (permissible substitution): Admit: 2020-07-11 | Payer: PRIVATE HEALTH INSURANCE | Attending: Clinical & Laboratory Immunology

## 2020-07-11 DIAGNOSIS — J3089 Other allergic rhinitis: Secondary | ICD-10-CM

## 2020-07-11 NOTE — Unmapped (Signed)
Patient hasn't had any reactions from their allergy injections. Doing well with their Immunotherapy. Mold and pollens avoidence guidelines were discussed. Patient was advised to continue their nasal rinsing and stay inside when mold and pollen counts were high. Patient was shone their allergy vials to verify their name and date of birth.

## 2020-07-17 ENCOUNTER — Ambulatory Visit: Payer: PRIVATE HEALTH INSURANCE

## 2020-07-24 ENCOUNTER — Institutional Professional Consult (permissible substitution): Admit: 2020-07-24 | Payer: PRIVATE HEALTH INSURANCE

## 2020-07-24 DIAGNOSIS — J3089 Other allergic rhinitis: Secondary | ICD-10-CM

## 2020-07-24 NOTE — Unmapped (Signed)
Extract #1: Escalating: TR, GR, W  Dose: 0.35 ml  Site: right arm  Administered by: Trudee Grip RN    No noted reactions from the previous injection. The patient denies any fever or any other symptoms from the last 24 hours.

## 2020-07-31 ENCOUNTER — Ambulatory Visit: Payer: PRIVATE HEALTH INSURANCE

## 2020-08-10 ENCOUNTER — Institutional Professional Consult (permissible substitution): Admit: 2020-08-10 | Discharge: 2020-08-10 | Payer: PRIVATE HEALTH INSURANCE

## 2020-08-10 DIAGNOSIS — J3089 Other allergic rhinitis: Secondary | ICD-10-CM

## 2020-08-10 NOTE — Unmapped (Signed)
Extract #1: Escalating: TR, GR, W  Dose: 0.35 ml  Site: left arm  Administered by: Trudee Grip RN    No noted reactions from the previous injection. The patient denies any fever or any other symptoms from the last 24 hours.     Missed last appointment. Dose repeated.

## 2020-08-17 ENCOUNTER — Ambulatory Visit: Payer: PRIVATE HEALTH INSURANCE

## 2020-08-24 ENCOUNTER — Ambulatory Visit: Payer: PRIVATE HEALTH INSURANCE

## 2020-08-31 ENCOUNTER — Ambulatory Visit: Payer: PRIVATE HEALTH INSURANCE

## 2020-10-04 ENCOUNTER — Ambulatory Visit: Payer: PRIVATE HEALTH INSURANCE
